# Patient Record
Sex: Male | Born: 1965 | Race: Black or African American | Hispanic: No | Marital: Single | Smoking: Never smoker
Health system: Southern US, Community
[De-identification: ages and names within clinical notes are randomized; demographics above are authoritative.]

## PROBLEM LIST (undated history)

## (undated) DIAGNOSIS — E119 Type 2 diabetes mellitus without complications: Secondary | ICD-10-CM

## (undated) DIAGNOSIS — D649 Anemia, unspecified: Secondary | ICD-10-CM

## (undated) DIAGNOSIS — N189 Chronic kidney disease, unspecified: Secondary | ICD-10-CM

## (undated) DIAGNOSIS — K219 Gastro-esophageal reflux disease without esophagitis: Secondary | ICD-10-CM

## (undated) DIAGNOSIS — Z9119 Patient's noncompliance with other medical treatment and regimen: Secondary | ICD-10-CM

## (undated) DIAGNOSIS — Z91199 Patient's noncompliance with other medical treatment and regimen due to unspecified reason: Secondary | ICD-10-CM

## (undated) DIAGNOSIS — I10 Essential (primary) hypertension: Secondary | ICD-10-CM

## (undated) HISTORY — PX: BACK SURGERY: SHX140

---

## 1997-12-21 ENCOUNTER — Encounter: Admission: RE | Admit: 1997-12-21 | Discharge: 1997-12-21 | Payer: Self-pay | Admitting: Hematology and Oncology

## 2010-03-06 ENCOUNTER — Emergency Department (HOSPITAL_COMMUNITY): Payer: Self-pay

## 2010-03-06 ENCOUNTER — Emergency Department (HOSPITAL_COMMUNITY)
Admission: EM | Admit: 2010-03-06 | Discharge: 2010-03-06 | Disposition: A | Discharge status: Home / Self Care | Payer: Self-pay | Attending: Emergency Medicine | Admitting: Emergency Medicine

## 2010-03-06 DIAGNOSIS — I10 Essential (primary) hypertension: Secondary | ICD-10-CM | POA: Insufficient documentation

## 2010-03-06 DIAGNOSIS — E119 Type 2 diabetes mellitus without complications: Secondary | ICD-10-CM | POA: Insufficient documentation

## 2010-03-06 DIAGNOSIS — R51 Headache: Secondary | ICD-10-CM | POA: Insufficient documentation

## 2010-03-06 LAB — CBC
HCT: 38.9 % — ABNORMAL LOW (ref 39.0–52.0)
Hemoglobin: 13.5 g/dL (ref 13.0–17.0)
MCV: 91.5 fL (ref 78.0–100.0)
RBC: 4.25 MIL/uL (ref 4.22–5.81)
RDW: 11.7 % (ref 11.5–15.5)
WBC: 5.5 10*3/uL (ref 4.0–10.5)

## 2010-03-06 LAB — DIFFERENTIAL
Eosinophils Relative: 4 % (ref 0–5)
Lymphocytes Relative: 38 % (ref 12–46)
Lymphs Abs: 2.1 10*3/uL (ref 0.7–4.0)

## 2010-03-06 LAB — COMPREHENSIVE METABOLIC PANEL
Alkaline Phosphatase: 44 U/L (ref 39–117)
BUN: 12 mg/dL (ref 6–23)
CO2: 27 mEq/L (ref 19–32)
Chloride: 102 mEq/L (ref 96–112)
Glucose, Bld: 212 mg/dL — ABNORMAL HIGH (ref 70–99)
Potassium: 4.2 mEq/L (ref 3.5–5.1)
Total Bilirubin: 0.5 mg/dL (ref 0.3–1.2)

## 2017-04-17 ENCOUNTER — Encounter (HOSPITAL_COMMUNITY): Payer: Self-pay | Admitting: Emergency Medicine

## 2017-04-17 ENCOUNTER — Emergency Department (HOSPITAL_COMMUNITY)
Admission: EM | Admit: 2017-04-17 | Discharge: 2017-04-17 | Disposition: A | Discharge status: Home / Self Care | Payer: Self-pay | Attending: Emergency Medicine | Admitting: Emergency Medicine

## 2017-04-17 ENCOUNTER — Emergency Department (HOSPITAL_COMMUNITY): Payer: Self-pay

## 2017-04-17 ENCOUNTER — Other Ambulatory Visit: Payer: Self-pay

## 2017-04-17 DIAGNOSIS — E1165 Type 2 diabetes mellitus with hyperglycemia: Secondary | ICD-10-CM | POA: Insufficient documentation

## 2017-04-17 DIAGNOSIS — R739 Hyperglycemia, unspecified: Secondary | ICD-10-CM

## 2017-04-17 DIAGNOSIS — Z79899 Other long term (current) drug therapy: Secondary | ICD-10-CM | POA: Insufficient documentation

## 2017-04-17 DIAGNOSIS — R1031 Right lower quadrant pain: Secondary | ICD-10-CM | POA: Insufficient documentation

## 2017-04-17 DIAGNOSIS — Z7984 Long term (current) use of oral hypoglycemic drugs: Secondary | ICD-10-CM | POA: Insufficient documentation

## 2017-04-17 DIAGNOSIS — R109 Unspecified abdominal pain: Secondary | ICD-10-CM

## 2017-04-17 HISTORY — DX: Type 2 diabetes mellitus without complications: E11.9

## 2017-04-17 LAB — CBC WITH DIFFERENTIAL/PLATELET
BASOS ABS: 0 10*3/uL (ref 0.0–0.1)
Basophils Relative: 0 %
Eosinophils Absolute: 0.1 10*3/uL (ref 0.0–0.7)
Eosinophils Relative: 1 %
HCT: 36 % — ABNORMAL LOW (ref 39.0–52.0)
Hemoglobin: 12.3 g/dL — ABNORMAL LOW (ref 13.0–17.0)
LYMPHS PCT: 35 %
Lymphs Abs: 2.2 10*3/uL (ref 0.7–4.0)
MCH: 31.5 pg (ref 26.0–34.0)
MCHC: 34.2 g/dL (ref 30.0–36.0)
MCV: 92.3 fL (ref 78.0–100.0)
Monocytes Absolute: 0.7 10*3/uL (ref 0.1–1.0)
Monocytes Relative: 11 %
Neutro Abs: 3.3 10*3/uL (ref 1.7–7.7)
Neutrophils Relative %: 53 %
PLATELETS: 303 10*3/uL (ref 150–400)
RBC: 3.9 MIL/uL — ABNORMAL LOW (ref 4.22–5.81)
RDW: 12.5 % (ref 11.5–15.5)
WBC: 6.4 10*3/uL (ref 4.0–10.5)

## 2017-04-17 LAB — COMPREHENSIVE METABOLIC PANEL
ALBUMIN: 2.7 g/dL — AB (ref 3.5–5.0)
ALT: 17 U/L (ref 17–63)
ANION GAP: 9 (ref 5–15)
AST: 19 U/L (ref 15–41)
Alkaline Phosphatase: 76 U/L (ref 38–126)
BUN: 12 mg/dL (ref 6–20)
CHLORIDE: 95 mmol/L — AB (ref 101–111)
CO2: 27 mmol/L (ref 22–32)
Calcium: 8.8 mg/dL — ABNORMAL LOW (ref 8.9–10.3)
Creatinine, Ser: 1.27 mg/dL — ABNORMAL HIGH (ref 0.61–1.24)
GFR calc non Af Amer: >60 mL/min (ref >60)
GLUCOSE: 505 mg/dL — AB (ref 65–99)
POTASSIUM: 4.2 mmol/L (ref 3.5–5.1)
SODIUM: 131 mmol/L — AB (ref 135–145)
Total Bilirubin: 0.5 mg/dL (ref 0.3–1.2)
Total Protein: 6.5 g/dL (ref 6.5–8.1)

## 2017-04-17 LAB — URINALYSIS, ROUTINE W REFLEX MICROSCOPIC
BACTERIA UA: NONE SEEN
BILIRUBIN URINE: NEGATIVE
Glucose, UA: >=500 mg/dL — AB
KETONES UR: NEGATIVE mg/dL
LEUKOCYTES UA: NEGATIVE
NITRITE: NEGATIVE
PH: 7 (ref 5.0–8.0)
SQUAMOUS EPITHELIAL / LPF: NONE SEEN
Specific Gravity, Urine: 1.021 (ref 1.005–1.030)

## 2017-04-17 MED ORDER — IBUPROFEN 400 MG PO TABS
400.0000 mg | ORAL_TABLET | Freq: Once | ORAL | Status: AC
  Administered 2017-04-17: 400 mg via ORAL
  Filled 2017-04-17: qty 1

## 2017-04-17 MED ORDER — METFORMIN HCL 500 MG PO TABS
500.0000 mg | ORAL_TABLET | Freq: Once | ORAL | Status: AC
  Administered 2017-04-17: 500 mg via ORAL
  Filled 2017-04-17: qty 1

## 2017-04-17 MED ORDER — METFORMIN HCL 500 MG PO TABS: 500.0000 mg | ORAL_TABLET | Freq: Two times a day (BID) | ORAL | 0 refills | Status: DC

## 2017-04-17 MED ORDER — LISINOPRIL 10 MG PO TABS: 10.0000 mg | ORAL_TABLET | Freq: Every day | ORAL | 0 refills | Status: DC

## 2017-04-17 MED ORDER — SODIUM CHLORIDE 0.9 % IV BOLUS
1000.0000 mL | Freq: Once | INTRAVENOUS | Status: AC
  Administered 2017-04-17: 1000 mL via INTRAVENOUS

## 2017-04-17 MED ORDER — IBUPROFEN 400 MG PO TABS: 400.0000 mg | ORAL_TABLET | Freq: Three times a day (TID) | ORAL | 0 refills | Status: AC

## 2017-04-17 MED ORDER — LISINOPRIL 10 MG PO TABS
10.0000 mg | ORAL_TABLET | Freq: Once | ORAL | Status: AC
  Administered 2017-04-17: 10 mg via ORAL
  Filled 2017-04-17: qty 1

## 2017-04-17 NOTE — ED Triage Notes (Signed)
PT c/o right sided lower back pain that radiates to the right lower abdomen with no n/v/d x1 week. PT also denies urinary  Symptoms.

## 2017-04-17 NOTE — ED Provider Notes (Signed)
[facility] EMERGENCY DEPARTMENT Provider Note   CSN: [id number] Arrival date & time: 04/17/17  1554     History   Chief Complaint Chief Complaint  Patient presents with  . Flank Pain    HPI Paul Merritt is a 52 y.o. male.  HPI Concern flank pain. Pain is been present for about 1 week, beginning without clear precipitant. Since onset the pain is been persistent, though with waxing/waning severity. Pain is from the right flank radiating towards the suprapubic region and right inguinal crease. No dysuria, no hematuria, no scrotal pain or swelling. No fever, no chills. There is some ongoing generalized weakness, but no other focal pain or focal weakness. No medication taken for relief. After speaking with a colleague who suggested the patient may have kidney stones he presents for evaluation.  Past Medical History:  Diagnosis Date  . Diabetes mellitus without complication ([place])     There are no active problems to display for this patient.   Past Surgical History:  Procedure Laterality Date  . BACK SURGERY          Home Medications    Prior to Admission medications   Medication Sig Start Date End Date Taking? Authorizing Provider  ibuprofen (ADVIL,MOTRIN) 400 MG tablet Take 1 tablet (400 mg total) by mouth 3 (three) times daily for 3 days. Take one tablet three times daily for three days 04/17/17 04/20/17  Carmin Muskrat, MD  lisinopril (PRINIVIL,ZESTRIL) 10 MG tablet Take 1 tablet (10 mg total) by mouth daily. 04/17/17   Carmin Muskrat, MD  metFORMIN (GLUCOPHAGE) 500 MG tablet Take 1 tablet (500 mg total) by mouth 2 (two) times daily. 04/17/17   Carmin Muskrat, MD    Family History History reviewed. No pertinent family history.  Social History Social History   Tobacco Use  . Smoking status: Never Smoker  . Smokeless tobacco: Never Used  Substance Use Topics  . Alcohol use: Yes    Comment: occ  . Drug use: Never     Allergies   Patient has no known  allergies.   Review of Systems Review of Systems  Constitutional:       Per HPI, otherwise negative  HENT:       Per HPI, otherwise negative  Respiratory:       Per HPI, otherwise negative  Cardiovascular:       Per HPI, otherwise negative  Gastrointestinal: Negative for vomiting.  Endocrine:       Negative aside from HPI  Genitourinary:       Neg aside from HPI   Musculoskeletal:       Per HPI, otherwise negative  Skin: Negative.   Neurological: Negative for syncope.     Physical Exam Updated Vital Signs BP (!) 207/119   Pulse 89   Temp 98.7 F (37.1 C) (Oral)   Resp 14   Ht 5\' 9"  (1.753 m)   Wt 82 kg (180 lb 12.8 oz)   SpO2 98%   BMI 26.70 kg/m   Physical Exam  Constitutional: He is oriented to person, place, and time. He appears well-developed. No distress.  HENT:  Head: Normocephalic and atraumatic.  Eyes: Conjunctivae and EOM are normal.  Cardiovascular: Normal rate and regular rhythm.  Pulmonary/Chest: Effort normal. No stridor. No respiratory distress.  Abdominal: He exhibits no distension.  No appreciable tenderness to palpation  Musculoskeletal: He exhibits no edema.  Neurological: He is alert and oriented to person, place, and time.  Skin: Skin is warm and dry.  Psychiatric: He has a normal mood and affect.  Nursing note and vitals reviewed.    ED Treatments / Results  Labs (all labs ordered are listed, but only abnormal results are displayed) Labs Reviewed  CBC WITH DIFFERENTIAL/PLATELET - Abnormal; Notable for the following components:      Result Value   RBC 3.90 (*)    Hemoglobin 12.3 (*)    HCT 36.0 (*)    All other components within normal limits  COMPREHENSIVE METABOLIC PANEL - Abnormal; Notable for the following components:   Sodium 131 (*)    Chloride 95 (*)    Glucose, Bld 505 (*)    Creatinine, Ser 1.27 (*)    Calcium 8.8 (*)    Albumin 2.7 (*)    All other components within normal limits  URINALYSIS, ROUTINE W REFLEX  MICROSCOPIC - Abnormal; Notable for the following components:   Color, Urine STRAW (*)    Glucose, UA >=500 (*)    Hgb urine dipstick MODERATE (*)    Protein, ur >=300 (*)    All other components within normal limits    EKG None  Radiology Ct Renal Stone Study  Result Date: 04/17/2017 CLINICAL DATA:  Right-sided lower back pain radiating to the right lower abdomen. EXAM: CT ABDOMEN AND PELVIS WITHOUT CONTRAST TECHNIQUE: Multidetector CT imaging of the abdomen and pelvis was performed following the standard protocol without IV contrast. COMPARISON:  None. FINDINGS: Lower chest: The included heart is normal in size without pericardial effusion. Bibasilar atelectasis is identified, slightly more confluent in the left lower lobe and may reflect an area of pneumonia. Subtle nodularity likely reflects adjacent area of pleuroparenchymal scarring based on the coronal and sagittal reformats. Hepatobiliary: Unremarkable noncontrast appearance of the liver. Physiologic distention of the gallbladder with subtle layering biliary sludge. The appendix is seen adjacent to the gallbladder in the gallbladder fossa. Pancreas: Mild fatty infiltration of the pancreas. No ductal dilatation, inflammation or mass. Spleen: Normal Adrenals/Urinary Tract: Normal bilateral adrenal glands. No hydroureteronephrosis or nephrolithiasis. The unenhanced kidneys are unremarkable without apparent mass. Unremarkable appearance of the bladder for degree of distention. Stomach/Bowel: Physiologic distention of the stomach with normal small bowel rotation. No bowel obstruction or inflammation. Moderate stool retention within the colon without obstruction or inflammation. Normal appendix as described, adjacent to the gallbladder without inflammation. Vascular/Lymphatic: No significant vascular findings are present. No enlarged abdominal or pelvic lymph nodes. Reproductive: Peripheral zone calcifications are seen of the normal size prostate.  Other: No free air nor free fluid. Tiny umbilical fat containing hernia. Musculoskeletal: Dextroconvex curvature of the thoracic spine traversed by single Harrington rod that extends to the L4 level. Associated lower lumbar degenerative disc disease with vacuum disc phenomena at L4-5 and L5-S1. Facet ankylosis is seen along the included thoracolumbar spine at L4. Facet arthropathy seen at L4-5. IMPRESSION: 1. Bibasilar atelectasis left greater than right. Slightly more confluent airspace opacities in the left lower lobe may reflect subtle pneumonia. Subtle nodularity within the left lower lobe is believed to represent atelectatic change and/or scarring when cross referenced with reformatted images. 2. No nephrolithiasis nor hydroureteronephrosis. 3. Dextroconvex curvature of the thoracolumbar spine with ankylosed facets to the level of the L4 vertebral body. Single Harrington rod is seen traversing the scoliotic spine, the distal most aspect extending to L4. No hardware failure. 4. Degenerative disc disease L4-5 and L5-S1 with facet arthropathy at L4-5. Electronically Signed   By: Ashley Royalty M.D.   On: 04/17/2017 18:05    Procedures  Procedures (including critical care time)  Medications Ordered in ED Medications  metFORMIN (GLUCOPHAGE) tablet 500 mg (has no administration in time range)  ibuprofen (ADVIL,MOTRIN) tablet 400 mg (has no administration in time range)  lisinopril (PRINIVIL,ZESTRIL) tablet 10 mg (has no administration in time range)  sodium chloride 0.9 % bolus 1,000 mL (1,000 mLs Intravenous New Bag/Given 04/17/17 1740)     Initial Impression / Assessment and Plan / ED Course  I have reviewed the triage vital signs and the nursing notes.  Pertinent labs & imaging results that were available during my care of the patient were reviewed by me and considered in my medical decision making (see chart for details).     6:46 PM On repeat exam patient is awake and alert, in no distress. We  discussed all findings, he has no new complaints per He now states that he was previously on medication for diabetes, but has not taken any metformin in months. She does have substantial hyperglycemia, but no anion gap, no distress, no nausea, no vomiting, low suspicion for DKA. Patient amenable to restarting metformin, lisinopril, and short course of ibuprofen for most likely recent passed kidney stone given his hematuria, flank pain. No evidence for obstruction, infection, and CT abdomen pelvis was otherwise generally reassuring. Some nausea possible atelectasis in the lungs, but no respiratory complaints, the patient is not hypoxic, patient will follow up with our outpatient colleagues in 1 week.  Final Clinical Impressions(s) / ED Diagnoses   Final diagnoses:  Right flank pain  Hyperglycemia    ED Discharge Orders        Ordered    ibuprofen (ADVIL,MOTRIN) 400 MG tablet  3 times daily     04/17/17 1837    metFORMIN (GLUCOPHAGE) 500 MG tablet  2 times daily     04/17/17 1837    lisinopril (PRINIVIL,ZESTRIL) 10 MG tablet  Daily     04/17/17 1846       Carmin Muskrat, MD 04/17/17 [phone]

## 2017-04-17 NOTE — ED Notes (Signed)
Lower back pain mainly on R lower back and sometimes in rt lower quadrant of abdomen. Experiencing pain for approx. 1 week. No change in level pain  Rated at 5/6. Pt has rod in his back but this is different pain. Has not used any medication for pain relief.

## 2017-04-17 NOTE — Discharge Instructions (Signed)
As discussed, today's evaluation has been generally reassuring, though there is evidence that you recently passed a kidney stone. Additionally, there is evidence for ongoing high blood glucose, it is important that you resume taking metformin. Please use the prescribed metformin, and prescribed ibuprofen as directed. Please be sure to follow-up with our primary care colleagues in 1 week for repeat evaluation or return here for concerning changes in your condition.

## 2017-04-17 NOTE — ED Notes (Signed)
Notified MD of pt.'s high BP

## 2017-04-17 NOTE — ED Notes (Signed)
CRITICAL VALUE ALERT  Critical Value:  Glucose 505  Date & Time Notied: 04/17/17 1706  Provider Notified: Notified Dr. Vanita Panda  Orders Received/Actio" Notified Dr. Vanita Panda

## 2017-10-02 ENCOUNTER — Emergency Department (HOSPITAL_COMMUNITY): Payer: Self-pay

## 2017-10-02 ENCOUNTER — Encounter (HOSPITAL_COMMUNITY): Payer: Self-pay

## 2017-10-02 ENCOUNTER — Inpatient Hospital Stay (HOSPITAL_COMMUNITY)
Admission: EM | Admit: 2017-10-02 | Discharge: 2017-10-05 | DRG: 377 | Disposition: A | Discharge status: Home / Self Care | Payer: Self-pay | Attending: Family Medicine | Admitting: Family Medicine

## 2017-10-02 ENCOUNTER — Other Ambulatory Visit: Payer: Self-pay

## 2017-10-02 DIAGNOSIS — Z9114 Patient's other noncompliance with medication regimen: Secondary | ICD-10-CM

## 2017-10-02 DIAGNOSIS — I248 Other forms of acute ischemic heart disease: Secondary | ICD-10-CM | POA: Diagnosis present

## 2017-10-02 DIAGNOSIS — Z91199 Patient's noncompliance with other medical treatment and regimen due to unspecified reason: Secondary | ICD-10-CM

## 2017-10-02 DIAGNOSIS — K921 Melena: Secondary | ICD-10-CM

## 2017-10-02 DIAGNOSIS — D62 Acute posthemorrhagic anemia: Secondary | ICD-10-CM | POA: Diagnosis present

## 2017-10-02 DIAGNOSIS — E871 Hypo-osmolality and hyponatremia: Secondary | ICD-10-CM

## 2017-10-02 DIAGNOSIS — K259 Gastric ulcer, unspecified as acute or chronic, without hemorrhage or perforation: Secondary | ICD-10-CM | POA: Diagnosis present

## 2017-10-02 DIAGNOSIS — Z7984 Long term (current) use of oral hypoglycemic drugs: Secondary | ICD-10-CM

## 2017-10-02 DIAGNOSIS — E1165 Type 2 diabetes mellitus with hyperglycemia: Secondary | ICD-10-CM

## 2017-10-02 DIAGNOSIS — E538 Deficiency of other specified B group vitamins: Secondary | ICD-10-CM | POA: Diagnosis present

## 2017-10-02 DIAGNOSIS — K922 Gastrointestinal hemorrhage, unspecified: Secondary | ICD-10-CM | POA: Diagnosis present

## 2017-10-02 DIAGNOSIS — K254 Chronic or unspecified gastric ulcer with hemorrhage: Principal | ICD-10-CM | POA: Diagnosis present

## 2017-10-02 DIAGNOSIS — Z9119 Patient's noncompliance with other medical treatment and regimen: Secondary | ICD-10-CM

## 2017-10-02 DIAGNOSIS — I10 Essential (primary) hypertension: Secondary | ICD-10-CM | POA: Diagnosis present

## 2017-10-02 DIAGNOSIS — E1100 Type 2 diabetes mellitus with hyperosmolarity without nonketotic hyperglycemic-hyperosmolar coma (NKHHC): Secondary | ICD-10-CM | POA: Diagnosis present

## 2017-10-02 DIAGNOSIS — N179 Acute kidney failure, unspecified: Secondary | ICD-10-CM | POA: Diagnosis present

## 2017-10-02 DIAGNOSIS — Z599 Problem related to housing and economic circumstances, unspecified: Secondary | ICD-10-CM

## 2017-10-02 DIAGNOSIS — IMO0002 Reserved for concepts with insufficient information to code with codable children: Secondary | ICD-10-CM | POA: Diagnosis present

## 2017-10-02 DIAGNOSIS — R079 Chest pain, unspecified: Secondary | ICD-10-CM

## 2017-10-02 DIAGNOSIS — I169 Hypertensive crisis, unspecified: Secondary | ICD-10-CM | POA: Diagnosis present

## 2017-10-02 DIAGNOSIS — B3781 Candidal esophagitis: Secondary | ICD-10-CM | POA: Diagnosis present

## 2017-10-02 DIAGNOSIS — I160 Hypertensive urgency: Secondary | ICD-10-CM | POA: Diagnosis present

## 2017-10-02 DIAGNOSIS — R809 Proteinuria, unspecified: Secondary | ICD-10-CM

## 2017-10-02 DIAGNOSIS — D649 Anemia, unspecified: Secondary | ICD-10-CM

## 2017-10-02 DIAGNOSIS — Z79899 Other long term (current) drug therapy: Secondary | ICD-10-CM

## 2017-10-02 HISTORY — DX: Patient's noncompliance with other medical treatment and regimen: Z91.19

## 2017-10-02 HISTORY — DX: Patient's noncompliance with other medical treatment and regimen due to unspecified reason: Z91.199

## 2017-10-02 HISTORY — DX: Essential (primary) hypertension: I10

## 2017-10-02 LAB — CBC WITH DIFFERENTIAL/PLATELET
Basophils Absolute: 0.1 10*3/uL (ref 0.0–0.1)
Basophils Relative: 1 %
EOS PCT: 3 %
Eosinophils Absolute: 0.2 10*3/uL (ref 0.0–0.7)
HCT: 23 % — ABNORMAL LOW (ref 39.0–52.0)
Hemoglobin: 7.8 g/dL — ABNORMAL LOW (ref 13.0–17.0)
Lymphocytes Relative: 26 %
Lymphs Abs: 1.9 10*3/uL (ref 0.7–4.0)
MCH: 32 pg (ref 26.0–34.0)
MCHC: 33.9 g/dL (ref 30.0–36.0)
MCV: 94.3 fL (ref 78.0–100.0)
MONO ABS: 0.7 10*3/uL (ref 0.1–1.0)
MONOS PCT: 10 %
Neutro Abs: 4.4 10*3/uL (ref 1.7–7.7)
Neutrophils Relative %: 60 %
PLATELETS: 347 10*3/uL (ref 150–400)
RBC: 2.44 MIL/uL — ABNORMAL LOW (ref 4.22–5.81)
RDW: 12.4 % (ref 11.5–15.5)
WBC: 7.3 10*3/uL (ref 4.0–10.5)

## 2017-10-02 LAB — RAPID URINE DRUG SCREEN, HOSP PERFORMED
AMPHETAMINES: NOT DETECTED
BARBITURATES: NOT DETECTED
Benzodiazepines: NOT DETECTED
COCAINE: NOT DETECTED
OPIATES: NOT DETECTED
TETRAHYDROCANNABINOL: NOT DETECTED

## 2017-10-02 LAB — URINALYSIS, ROUTINE W REFLEX MICROSCOPIC
BACTERIA UA: NONE SEEN
Bilirubin Urine: NEGATIVE
Glucose, UA: >=500 mg/dL — AB
KETONES UR: NEGATIVE mg/dL
Leukocytes, UA: NEGATIVE
Nitrite: NEGATIVE
PH: 7 (ref 5.0–8.0)
Protein, ur: >=300 mg/dL — AB
SPECIFIC GRAVITY, URINE: 1.017 (ref 1.005–1.030)

## 2017-10-02 LAB — FOLATE: FOLATE: 5.1 ng/mL — AB (ref >5.9)

## 2017-10-02 LAB — BASIC METABOLIC PANEL
Anion gap: 4 — ABNORMAL LOW (ref 5–15)
BUN: 27 mg/dL — AB (ref 6–20)
CALCIUM: 8.2 mg/dL — AB (ref 8.9–10.3)
CO2: 24 mmol/L (ref 22–32)
CREATININE: 1.99 mg/dL — AB (ref 0.61–1.24)
Chloride: 101 mmol/L (ref 98–111)
GFR calc Af Amer: 43 mL/min — ABNORMAL LOW (ref >60)
GFR calc non Af Amer: 37 mL/min — ABNORMAL LOW (ref >60)
GLUCOSE: 431 mg/dL — AB (ref 70–99)
Potassium: 5 mmol/L (ref 3.5–5.1)
Sodium: 129 mmol/L — ABNORMAL LOW (ref 135–145)

## 2017-10-02 LAB — CBG MONITORING, ED: GLUCOSE-CAPILLARY: 442 mg/dL — AB (ref 70–99)

## 2017-10-02 LAB — GLUCOSE, CAPILLARY
Glucose-Capillary: 163 mg/dL — ABNORMAL HIGH (ref 70–99)
Glucose-Capillary: 197 mg/dL — ABNORMAL HIGH (ref 70–99)

## 2017-10-02 LAB — IRON AND TIBC
Iron: 30 ug/dL — ABNORMAL LOW (ref 45–182)
Saturation Ratios: 14 % — ABNORMAL LOW (ref 17.9–39.5)
TIBC: 207 ug/dL — ABNORMAL LOW (ref 250–450)
UIBC: 177 ug/dL

## 2017-10-02 LAB — FERRITIN: Ferritin: 27 ng/mL (ref 24–336)

## 2017-10-02 LAB — PREPARE RBC (CROSSMATCH)

## 2017-10-02 LAB — VITAMIN B12: VITAMIN B 12: 208 pg/mL (ref 180–914)

## 2017-10-02 LAB — TROPONIN I
Troponin I: <0.03 ng/mL (ref <0.03)
Troponin I: <0.03 ng/mL (ref <0.03)

## 2017-10-02 LAB — ABO/RH: ABO/RH(D): A POS

## 2017-10-02 MED ORDER — SODIUM CHLORIDE 0.9 % IV BOLUS
1000.0000 mL | Freq: Once | INTRAVENOUS | Status: AC
  Administered 2017-10-02: 1000 mL via INTRAVENOUS

## 2017-10-02 MED ORDER — HYDRALAZINE HCL 20 MG/ML IJ SOLN
20.0000 mg | Freq: Four times a day (QID) | INTRAMUSCULAR | Status: DC | PRN
  Administered 2017-10-03: 20 mg via INTRAVENOUS
  Filled 2017-10-02: qty 1

## 2017-10-02 MED ORDER — ALBUTEROL SULFATE (2.5 MG/3ML) 0.083% IN NEBU: 2.5000 mg | INHALATION_SOLUTION | RESPIRATORY_TRACT | Status: DC | PRN

## 2017-10-02 MED ORDER — LABETALOL HCL 5 MG/ML IV SOLN
5.0000 mg | Freq: Once | INTRAVENOUS | Status: AC
  Administered 2017-10-02: 5 mg via INTRAVENOUS
  Filled 2017-10-02: qty 4

## 2017-10-02 MED ORDER — SODIUM CHLORIDE 0.9% IV SOLUTION: Freq: Once | INTRAVENOUS | Status: DC

## 2017-10-02 MED ORDER — SODIUM CHLORIDE 0.9 % IV SOLN
8.0000 mg/h | INTRAVENOUS | Status: DC
  Administered 2017-10-03: 8 mg/h via INTRAVENOUS
  Filled 2017-10-02 (×6): qty 80

## 2017-10-02 MED ORDER — SODIUM CHLORIDE 0.9 % IV SOLN
INTRAVENOUS | Status: AC
  Administered 2017-10-02: 19:00:00 via INTRAVENOUS

## 2017-10-02 MED ORDER — SENNOSIDES-DOCUSATE SODIUM 8.6-50 MG PO TABS: 1.0000 | ORAL_TABLET | Freq: Every evening | ORAL | Status: DC | PRN

## 2017-10-02 MED ORDER — INSULIN GLARGINE 100 UNIT/ML ~~LOC~~ SOLN
10.0000 [IU] | Freq: Two times a day (BID) | SUBCUTANEOUS | Status: DC
  Administered 2017-10-02 – 2017-10-04 (×3): 10 [IU] via SUBCUTANEOUS
  Filled 2017-10-02 (×10): qty 0.1

## 2017-10-02 MED ORDER — INSULIN ASPART 100 UNIT/ML ~~LOC~~ SOLN
10.0000 [IU] | Freq: Once | SUBCUTANEOUS | Status: AC
  Administered 2017-10-02: 10 [IU] via SUBCUTANEOUS

## 2017-10-02 MED ORDER — ONDANSETRON HCL 4 MG/2ML IJ SOLN: 4.0000 mg | Freq: Four times a day (QID) | INTRAMUSCULAR | Status: DC | PRN

## 2017-10-02 MED ORDER — ACETAMINOPHEN 650 MG RE SUPP: 650.0000 mg | Freq: Four times a day (QID) | RECTAL | Status: DC | PRN

## 2017-10-02 MED ORDER — SODIUM CHLORIDE 0.9 % IV SOLN
80.0000 mg | Freq: Once | INTRAVENOUS | Status: AC
  Administered 2017-10-03: 80 mg via INTRAVENOUS
  Filled 2017-10-02: qty 40

## 2017-10-02 MED ORDER — INSULIN ASPART 100 UNIT/ML ~~LOC~~ SOLN
0.0000 [IU] | Freq: Every day | SUBCUTANEOUS | Status: DC
  Administered 2017-10-03: 2 [IU] via SUBCUTANEOUS

## 2017-10-02 MED ORDER — PANTOPRAZOLE SODIUM 40 MG IV SOLR: 40.0000 mg | Freq: Two times a day (BID) | INTRAVENOUS | Status: DC

## 2017-10-02 MED ORDER — LISINOPRIL 10 MG PO TABS
10.0000 mg | ORAL_TABLET | Freq: Every day | ORAL | Status: DC
  Administered 2017-10-02: 10 mg via ORAL
  Filled 2017-10-02: qty 1

## 2017-10-02 MED ORDER — HYDRALAZINE HCL 20 MG/ML IJ SOLN: 20.0000 mg | Freq: Three times a day (TID) | INTRAMUSCULAR | Status: DC | PRN

## 2017-10-02 MED ORDER — PANTOPRAZOLE SODIUM 40 MG IV SOLR
INTRAVENOUS | Status: AC
  Filled 2017-10-02: qty 160

## 2017-10-02 MED ORDER — ACETAMINOPHEN 325 MG PO TABS: 650.0000 mg | ORAL_TABLET | Freq: Four times a day (QID) | ORAL | Status: DC | PRN

## 2017-10-02 MED ORDER — ONDANSETRON HCL 4 MG PO TABS: 4.0000 mg | ORAL_TABLET | Freq: Four times a day (QID) | ORAL | Status: DC | PRN

## 2017-10-02 MED ORDER — SODIUM CHLORIDE 0.9% FLUSH
3.0000 mL | Freq: Two times a day (BID) | INTRAVENOUS | Status: DC
  Administered 2017-10-03 (×2): 3 mL via INTRAVENOUS

## 2017-10-02 MED ORDER — INSULIN ASPART 100 UNIT/ML ~~LOC~~ SOLN
0.0000 [IU] | Freq: Three times a day (TID) | SUBCUTANEOUS | Status: DC
  Administered 2017-10-03: 2 [IU] via SUBCUTANEOUS
  Administered 2017-10-04: 3 [IU] via SUBCUTANEOUS
  Administered 2017-10-04: 2 [IU] via SUBCUTANEOUS
  Administered 2017-10-04: 5 [IU] via SUBCUTANEOUS

## 2017-10-02 MED ORDER — INSULIN ASPART 100 UNIT/ML ~~LOC~~ SOLN
5.0000 [IU] | Freq: Once | SUBCUTANEOUS | Status: AC
  Administered 2017-10-02: 5 [IU] via SUBCUTANEOUS
  Filled 2017-10-02: qty 1

## 2017-10-02 NOTE — ED Notes (Signed)
EDP notified of cbg 442 and bp 187/104.  Labs ordered and ekg performed.

## 2017-10-02 NOTE — ED Provider Notes (Signed)
[facility] EMERGENCY DEPARTMENT Provider Note   CSN: [id number] Arrival date & time: 10/02/17  1119     History   Chief Complaint Chief Complaint  Patient presents with  . Headache  . Blurred Vision    HPI Paul Merritt is a 52 y.o. male.  HPI  Pt was seen at 1510. Per pt, c/o unknown onset and persistence of constant "blurry vision" that was noticed when he woke up yesterday morning. Pt states he also had a 30 minute episode of left sided chest "pain" yesterday. This CP occurred on exertion, improved with resting. Pt states he has not taken his BP or DM meds for over 1 month because he does not have a PCP. Pt does not check his CBG or BP at home. Denies CP currently. Denies eye pain, no injury, no discharge, no double vision. Denies palpitations, no SOB/cough, no abd pain, no N/V/D, no back pain, no focal motor weakness, no tingling/numbness in extremities, no fevers, no rash.    Past Medical History:  Diagnosis Date  . Diabetes mellitus without complication ([place])   . Hypertension   . Noncompliance     There are no active problems to display for this patient.   Past Surgical History:  Procedure Laterality Date  . BACK SURGERY          Home Medications    Prior to Admission medications   Medication Sig Start Date End Date Taking? Authorizing Provider  naproxen sodium (ALEVE) 220 MG tablet Take 220-440 mg by mouth daily as needed.   Yes [provider]  lisinopril (PRINIVIL,ZESTRIL) 10 MG tablet Take 1 tablet (10 mg total) by mouth daily. Patient not taking: Reported on 10/02/2017 04/17/17   Carmin Muskrat, MD  metFORMIN (GLUCOPHAGE) 500 MG tablet Take 1 tablet (500 mg total) by mouth 2 (two) times daily. Patient not taking: Reported on 10/02/2017 04/17/17   Carmin Muskrat, MD    Family History No family history on file.  Social History Social History   Tobacco Use  . Smoking status: Never Smoker  . Smokeless tobacco: Never Used  Substance Use  Topics  . Alcohol use: Yes    Comment: occ  . Drug use: Never     Allergies   Patient has no known allergies.   Review of Systems Review of Systems ROS: Statement: All systems negative except as marked or noted in the HPI; Constitutional: Negative for fever and chills. ; ; Eyes: Negative for eye pain, redness and discharge. ; ; ENMT: Negative for ear pain, hoarseness, nasal congestion, sinus pressure and sore throat. ; ; Cardiovascular: +CP. Negative for palpitations, diaphoresis, dyspnea and peripheral edema. ; ; Respiratory: Negative for cough, wheezing and stridor. ; ; Gastrointestinal: Negative for nausea, vomiting, diarrhea, abdominal pain, blood in stool, hematemesis, jaundice and rectal bleeding. . ; ; Genitourinary: Negative for dysuria, flank pain and hematuria. ; ; Musculoskeletal: Negative for back pain and neck pain. Negative for swelling and trauma.; ; Skin: Negative for pruritus, rash, abrasions, blisters, bruising and skin lesion.; ; Neuro: +blurry vision. Negative for headache, lightheadedness and neck stiffness. Negative for weakness, altered level of consciousness, altered mental status, extremity weakness, paresthesias, involuntary movement, seizure and syncope.       Physical Exam Updated Vital Signs BP (!) 195/107 (BP Location: Left Arm)   Pulse 93   Temp 98.1 F (36.7 C) (Oral)   Resp 16   Ht 5\' 9"  (1.753 m)   Wt 90.7 kg   SpO2 100%  BMI 29.53 kg/m    Patient Vitals for the past 24 hrs:  BP Temp Temp src Pulse Resp SpO2 Height Weight  10/02/17 1522 (!) 195/107 - - 93 16 100 % - -  10/02/17 1244 (!) 182/98 98.1 F (36.7 C) Oral 95 18 99 % - -  10/02/17 1139 - - - - - - 5\' 9"  (1.753 m) 90.7 kg  10/02/17 1138 (!) 187/104 97.9 F (36.6 C) Oral 95 20 100 % - -   16:42 Visual Acuity FS  Visual Acuity  Bilateral Distance: 20/70   R Distance: 20/70   L Distance: 20/50      Physical Exam 1515: Physical examination:  Nursing notes reviewed; Vital signs  and O2 SAT reviewed;  Constitutional: Well developed, Well nourished, Well hydrated, In no acute distress; Head:  Normocephalic, atraumatic; Eyes: EOMI, PERRL, No scleral icterus; ENMT: Mouth and pharynx normal, Mucous membranes moist; Neck: Supple, Full range of motion, No lymphadenopathy; Cardiovascular: Regular rate and rhythm, No gallop; Respiratory: Breath sounds clear & equal bilaterally, No wheezes.  Speaking full sentences with ease, Normal respiratory effort/excursion; Chest: Nontender, Movement normal; Abdomen: Soft, Nontender, Nondistended, Normal bowel sounds. Rectal exam performed w/permission of pt and ED RN chaperone present.  Anal tone normal.  Non-tender, black stool in rectal vault, heme positive.  No fissures, no external hemorrhoids, no palp masses.;;; Genitourinary: No CVA tenderness; Extremities: Peripheral pulses normal, No tenderness, No edema, No calf edema or asymmetry.; Neuro: AA&Ox3, Major CN grossly intact. No facial droop. Speech clear. No gross focal motor or sensory deficits in extremities. Climbs on and off stretcher easily by himself. Gait steady..; Skin: Color normal, Warm, Dry.   ED Treatments / Results  Labs (all labs ordered are listed, but only abnormal results are displayed)   EKG EKG Interpretation  Date/Time:  Wednesday October 02 2017 11:46:29 EDT Ventricular Rate:  99 PR Interval:  140 QRS Duration: 82 QT Interval:  324 QTC Calculation: 415 R Axis:   66 Text Interpretation:  Normal sinus rhythm Normal ECG No old tracing to compare Confirmed by Francine Graven [phone]) on 10/02/2017 4:00:39 PM   Radiology   Procedures Procedures (including critical care time)  Medications Ordered in ED Medications - No data to display   Initial Impression / Assessment and Plan / ED Course  I have reviewed the triage vital signs and the nursing notes.  Pertinent labs & imaging results that were available during my care of the patient were reviewed by me  and considered in my medical decision making (see chart for details).  MDM Reviewed: previous chart, nursing note and vitals Reviewed previous: labs Interpretation: labs, ECG, x-ray and CT scan Total time providing critical care: 30-74 minutes. This excludes time spent performing separately reportable procedures and services. Consults: admitting MD   CRITICAL CARE Performed by: Francine Graven Total critical care time: 35 minutes Critical care time was exclusive of separately billable procedures and treating other patients. Critical care was necessary to treat or prevent imminent or life-threatening deterioration. Critical care was time spent personally by me on the following activities: development of treatment plan with patient and/or surrogate as well as nursing, discussions with consultants, evaluation of patient's response to treatment, examination of patient, obtaining history from patient or surrogate, ordering and performing treatments and interventions, ordering and review of laboratory studies, ordering and review of radiographic studies, pulse oximetry and re-evaluation of patient's condition.   Results for orders placed or performed during the hospital encounter of 10/02/17  CBC with Differential  Result Value Ref Range   WBC 7.3 4.0 - 10.5 K/uL   RBC 2.44 (L) 4.22 - 5.81 MIL/uL   Hemoglobin 7.8 (L) 13.0 - 17.0 g/dL   HCT 23.0 (L) 39.0 - 52.0 %   MCV 94.3 78.0 - 100.0 fL   MCH 32.0 26.0 - 34.0 pg   MCHC 33.9 30.0 - 36.0 g/dL   RDW 12.4 11.5 - 15.5 %   Platelets 347 150 - 400 K/uL   Neutrophils Relative % 60 %   Neutro Abs 4.4 1.7 - 7.7 K/uL   Lymphocytes Relative 26 %   Lymphs Abs 1.9 0.7 - 4.0 K/uL   Monocytes Relative 10 %   Monocytes Absolute 0.7 0.1 - 1.0 K/uL   Eosinophils Relative 3 %   Eosinophils Absolute 0.2 0.0 - 0.7 K/uL   Basophils Relative 1 %   Basophils Absolute 0.1 0.0 - 0.1 K/uL  Basic metabolic panel  Result Value Ref Range   Sodium 129 (L) 135  - 145 mmol/L   Potassium 5.0 3.5 - 5.1 mmol/L   Chloride 101 98 - 111 mmol/L   CO2 24 22 - 32 mmol/L   Glucose, Bld 431 (H) 70 - 99 mg/dL   BUN 27 (H) 6 - 20 mg/dL   Creatinine, Ser 1.99 (H) 0.61 - 1.24 mg/dL   Calcium 8.2 (L) 8.9 - 10.3 mg/dL   GFR calc non Af Amer 37 (L) >60 mL/min   GFR calc Af Amer 43 (L) >60 mL/min   Anion gap 4 (L) 5 - 15  Troponin I  Result Value Ref Range   Troponin I <0.03 <0.03 ng/mL  Urinalysis, Routine w reflex microscopic  Result Value Ref Range   Color, Urine STRAW (A) YELLOW   APPearance CLEAR CLEAR   Specific Gravity, Urine 1.017 1.005 - 1.030   pH 7.0 5.0 - 8.0   Glucose, UA >=500 (A) NEGATIVE mg/dL   Hgb urine dipstick SMALL (A) NEGATIVE   Bilirubin Urine NEGATIVE NEGATIVE   Ketones, ur NEGATIVE NEGATIVE mg/dL   Protein, ur >=300 (A) NEGATIVE mg/dL   Nitrite NEGATIVE NEGATIVE   Leukocytes, UA NEGATIVE NEGATIVE   RBC / HPF 6-10 0 - 5 RBC/hpf   WBC, UA 0-5 0 - 5 WBC/hpf   Bacteria, UA NONE SEEN NONE SEEN  Urine rapid drug screen (hosp performed)  Result Value Ref Range   Opiates NONE DETECTED NONE DETECTED   Cocaine NONE DETECTED NONE DETECTED   Benzodiazepines NONE DETECTED NONE DETECTED   Amphetamines NONE DETECTED NONE DETECTED   Tetrahydrocannabinol NONE DETECTED NONE DETECTED   Barbiturates NONE DETECTED NONE DETECTED  CBG monitoring, ED  Result Value Ref Range   Glucose-Capillary 442 (H) 70 - 99 mg/dL   Comment 1 Notify RN   Type and screen  Result Value Ref Range   ABO/RH(D) A POS    Antibody Screen PENDING    Sample Expiration      10/05/2017 Performed at [facility], [address], [place], [place] [address]    Dg Chest 2 View Result Date: 10/02/2017 CLINICAL DATA:  Blurry vision and hypertension. EXAM: CHEST - 2 VIEW COMPARISON:  None. FINDINGS: The heart size and mediastinal contours are within normal limits. Both lungs are clear. Incompletely visualized spinal rod with right convex scoliosis. IMPRESSION: No active  cardiopulmonary disease. Electronically Signed   By: Ulyses Jarred M.D.   On: 10/02/2017 16:32   Ct Head Wo Contrast Result Date: 10/02/2017 CLINICAL DATA:  Blurry vision. EXAM: CT HEAD WITHOUT CONTRAST TECHNIQUE: Contiguous axial images were obtained from the base of the skull through the vertex without intravenous contrast. COMPARISON:  CT scan of March 06, 2010. FINDINGS: Brain: No evidence of acute infarction, hemorrhage, hydrocephalus, extra-axial collection or mass lesion/mass effect. Vascular: No hyperdense vessel or unexpected calcification. Skull: Normal. Negative for fracture or focal lesion. Sinuses/Orbits: No acute finding. Other: None. IMPRESSION: Normal head CT. Electronically Signed   By: Marijo Conception, M.D.   On: 10/02/2017 16:37   Results for Paul Merritt, Paul Merritt (MRN [id number]) as of 10/02/2017 17:03  Ref. Range 03/06/2010 13:57 04/17/2017 16:20 10/02/2017 12:32  Hemoglobin Latest Ref Range: 13.0 - 17.0 g/dL 13.5 12.3 (L) 7.8 (L)  HCT Latest Ref Range: 39.0 - 52.0 % 38.9 (L) 36.0 (L) 23.0 (L)   Results for Paul Merritt, Paul Merritt (MRN [id number]) as of 10/02/2017 17:03  Ref. Range 03/06/2010 13:57 04/17/2017 16:20 10/02/2017 12:32  BUN Latest Ref Range: 6 - 20 mg/dL 12 12 27  (H)  Creatinine Latest Ref Range: 0.61 - 1.24 mg/dL 0.99 1.27 (H) 1.99 (H)     1715:  Low dose IV labetolol given for HTN. Denies CP while in the ED. H/H drop over the past 5 months with black heme positive stool on exam. Pt however denies his stools are black or bloody.  BUN/Cr has also elevated over the past 5 months. Na corrects to 134 for elevated glucose. AG normal. IVF NS bolus and SQ insulin ordered.  Dx and testing d/w pt.  Questions answered.  Verb understanding, agreeable to admit T/C returned from [facility] Dr. Manuella Ghazi, case discussed, including:  HPI, pertinent PM/SHx, VS/PE, dx testing, ED course and treatment:  Agreeable to admit.     Final Clinical Impressions(s) / ED Diagnoses   Final diagnoses:  None    ED  Discharge Orders    None       Francine Graven, DO 10/04/17 2057

## 2017-10-02 NOTE — ED Triage Notes (Signed)
Pt reports woke up yesterday and vision was blurry.  Pt denies dizziness.  Reports has a slight headache that started today on the way to the hospital but says it is gone now.    Denies any weakness, numbness, or difficulty speaking or swallowing.  Pt says had a short episode of pain under left breast 2 days ago that went away after taking aleve.  Pt's bp today 187/104.  Pt says his bp medication was prescribed from the ER but he hasn't had it refilled because he doesn't have a pcp.  Pt also takes metformin but is out of it as well.

## 2017-10-02 NOTE — H&P (Signed)
History and Physical  Paul Merritt WHQ:[id number] DOB: 04/16/1965 DOA: 10/02/2017  Referring physician: EDP PCP: Patient, No Pcp Per  Patient coming from: Home  Chief Complaint: Fatigue, blurred vision  HPI: Paul Merritt is a 52 y.o. male with medical history significant for hypertension, diabetes, medication noncompliance, presents to the ED complaining of generalized fatigue, blurred vision, and left-sided chest pain. Left-sided chest pain which he describes as pain on the left breast has been ongoing intermittently for about 1 month, of which he has been taking Aleve twice daily for about a month.  No radiation, no associated shortness of breath.  Currently denies any left-sided chest pain.  For the past few days, patient has been reporting fatigue, with some blurry vision.  Patient denies any abdominal pain, nausea/vomiting, diaphoresis, dizziness, melena, BRBPR, hematemesis.  Of note, patient reports he has not had his blood pressure medication or diabetes medication for about a month as he could not afford to buy them and does not have a PCP.  ED Course: Pt was noted to be in hypertensive crisis, SBP in 190s, CBG also noted to be in the 400s, hyponatremia, AKI, and acute blood loss anemia with hemoglobin of 7.8, 5 months ago it was about 12.3.  Patient admitted for further management  Review of Systems: Review of systems are otherwise negative   Past Medical History:  Diagnosis Date  . Diabetes mellitus without complication ([place])   . Hypertension   . Noncompliance    Past Surgical History:  Procedure Laterality Date  . BACK SURGERY      Social History:  reports that he has never smoked. He has never used smokeless tobacco. He reports that he drinks alcohol. He reports that he does not use drugs.   No Known Allergies  History reviewed. No pertinent family history.    Prior to Admission medications   Medication Sig Start Date End Date Taking? Authorizing Provider  naproxen  sodium (ALEVE) 220 MG tablet Take 220-440 mg by mouth daily as needed.   Yes [provider]  lisinopril (PRINIVIL,ZESTRIL) 10 MG tablet Take 1 tablet (10 mg total) by mouth daily. Patient not taking: Reported on 10/02/2017 04/17/17   Carmin Muskrat, MD  metFORMIN (GLUCOPHAGE) 500 MG tablet Take 1 tablet (500 mg total) by mouth 2 (two) times daily. Patient not taking: Reported on 10/02/2017 04/17/17   Carmin Muskrat, MD    Physical Exam: BP (!) 156/92   Pulse 93   Temp 97.7 F (36.5 C) (Oral)   Resp 16   Ht 5\' 9"  (1.753 m)   Wt 85.8 kg   SpO2 100%   BMI 27.93 kg/m   General: NAD Eyes: Pallor ENT: Normal Neck: Supple Cardiovascular: S1, S2 present Respiratory: CTA B Abdomen: Soft, nontender, nondistended, bowel sounds present Skin: Normal Musculoskeletal: 2+ bilateral pedal edema Psychiatric: Normal mood Neurologic: No focal neurologic deficit noted          Labs on Admission:  Basic Metabolic Panel: Recent Labs  Lab 10/02/17 1232  NA 129*  K 5.0  CL 101  CO2 24  GLUCOSE 431*  BUN 27*  CREATININE 1.99*  CALCIUM 8.2*   Liver Function Tests: No results for input(s): AST, ALT, ALKPHOS, BILITOT, PROT, ALBUMIN in the last 168 hours. No results for input(s): LIPASE, AMYLASE in the last 168 hours. No results for input(s): AMMONIA in the last 168 hours. CBC: Recent Labs  Lab 10/02/17 1232  WBC 7.3  NEUTROABS 4.4  HGB 7.8*  HCT 23.0*  MCV 94.3  PLT 347   Cardiac Enzymes: Recent Labs  Lab 10/02/17 1537  TROPONINI <0.03    BNP (last 3 results) No results for input(s): BNP in the last 8760 hours.  ProBNP (last 3 results) No results for input(s): PROBNP in the last 8760 hours.  CBG: Recent Labs  Lab 10/02/17 1142 10/02/17 1847  GLUCAP 442* 163*    Radiological Exams on Admission: Dg Chest 2 View  Result Date: 10/02/2017 CLINICAL DATA:  Blurry vision and hypertension. EXAM: CHEST - 2 VIEW COMPARISON:  None. FINDINGS: The heart size and  mediastinal contours are within normal limits. Both lungs are clear. Incompletely visualized spinal rod with right convex scoliosis. IMPRESSION: No active cardiopulmonary disease. Electronically Signed   By: Ulyses Jarred M.D.   On: 10/02/2017 16:32   Ct Head Wo Contrast  Result Date: 10/02/2017 CLINICAL DATA:  Blurry vision. EXAM: CT HEAD WITHOUT CONTRAST TECHNIQUE: Contiguous axial images were obtained from the base of the skull through the vertex without intravenous contrast. COMPARISON:  CT scan of March 06, 2010. FINDINGS: Brain: No evidence of acute infarction, hemorrhage, hydrocephalus, extra-axial collection or mass lesion/mass effect. Vascular: No hyperdense vessel or unexpected calcification. Skull: Normal. Negative for fracture or focal lesion. Sinuses/Orbits: No acute finding. Other: None. IMPRESSION: Normal head CT. Electronically Signed   By: Marijo Conception, M.D.   On: 10/02/2017 16:37    EKG: Independently reviewed.  Normal sinus rhythm  Assessment/Plan Present on Admission: . GI bleed . Acute blood loss anemia . Hypertensive crisis . Uncontrolled diabetes mellitus ([place]) . Hyponatremia . AKI (acute kidney injury) ([place])  Principal Problem:   Hypertensive crisis Active Problems:   GI bleed   Acute blood loss anemia   Uncontrolled diabetes mellitus ([facility])   Hyponatremia   Noncompliance   AKI (acute kidney injury) ([place])  Hypertensive crisis Likely secondary to noncompliance CT head negative UDS negative Hydralazine IV as needed, hold home lisinopril for now due to AKI  Acute drop in hemoglobin likely 2/2 upper GI bleed Daily use of Aleve for about 1 month, denies melena, BRBPR Hemoglobin 7.8 on presentation, baseline normal FOBT pending Will transfuse 2U of PRBC Start Protonix drip, gentle hydration Serial CBC GI consulted will see patient in a.m.  Chest pain, ACS rule out Likely atypical, currently chest pain-free ED troponin less than 0.03, will trend EKG  normal sinus rhythm Chest x-ray unremarkable Echo pending Telemetry, monitor closely  Type 2 diabetes mellitus with hyperglycemia Uncontrolled, likely due to noncompliance A1c pending Start SSI, Lantus 10 twice daily, Accu-Cheks Monitor closely  Hyponatremia Likely secondary to hyperglycemia, should resolve with correction Daily BMP  AKI Likely due to ?GIB Hold home lisinopril for now Gentle hydration     DVT prophylaxis: SCDs  Code Status: Full  Family Communication: None at bedside  Disposition Plan: Once work-up complete  Consults called: GI  Admission status: Observation    Alma Friendly MD [facility]   If 7PM-7AM, please contact night-coverage [email or web address]   10/02/2017, 6:54 PM

## 2017-10-02 NOTE — ED Notes (Signed)
Pt reports has not been able to take his meds for over a month due to lack of medical insurance. Reports was on metformin for diabetes but does not recall med for blood pressure. Reports blurred vision onset yesterday with mild left sided chest pain described as " pricking pain ". Pt denies blood in urine,stool or emesis. Pt sclera pale. Hgb/hct 7.8/23.0 guiac card done by provider. Pt aware of tentative plan to admit.

## 2017-10-03 ENCOUNTER — Encounter (HOSPITAL_COMMUNITY): Payer: Self-pay | Admitting: Gastroenterology

## 2017-10-03 ENCOUNTER — Observation Stay (HOSPITAL_COMMUNITY): Payer: Self-pay | Admitting: Anesthesiology

## 2017-10-03 ENCOUNTER — Observation Stay (HOSPITAL_BASED_OUTPATIENT_CLINIC_OR_DEPARTMENT_OTHER): Payer: Self-pay

## 2017-10-03 ENCOUNTER — Other Ambulatory Visit: Payer: Self-pay

## 2017-10-03 ENCOUNTER — Encounter (HOSPITAL_COMMUNITY)
Admission: EM | Disposition: A | Discharge status: Home / Self Care | Payer: Self-pay | Source: Home / Self Care | Attending: Internal Medicine

## 2017-10-03 DIAGNOSIS — K921 Melena: Secondary | ICD-10-CM

## 2017-10-03 DIAGNOSIS — I340 Nonrheumatic mitral (valve) insufficiency: Secondary | ICD-10-CM

## 2017-10-03 DIAGNOSIS — D62 Acute posthemorrhagic anemia: Secondary | ICD-10-CM

## 2017-10-03 DIAGNOSIS — K259 Gastric ulcer, unspecified as acute or chronic, without hemorrhage or perforation: Secondary | ICD-10-CM

## 2017-10-03 DIAGNOSIS — K228 Other specified diseases of esophagus: Secondary | ICD-10-CM

## 2017-10-03 HISTORY — PX: BIOPSY: SHX5522

## 2017-10-03 HISTORY — PX: ESOPHAGOGASTRODUODENOSCOPY (EGD) WITH PROPOFOL: SHX5813

## 2017-10-03 LAB — BASIC METABOLIC PANEL
ANION GAP: 4 — AB (ref 5–15)
BUN: 24 mg/dL — ABNORMAL HIGH (ref 6–20)
CALCIUM: 7.8 mg/dL — AB (ref 8.9–10.3)
CO2: 24 mmol/L (ref 22–32)
Chloride: 108 mmol/L (ref 98–111)
Creatinine, Ser: 1.82 mg/dL — ABNORMAL HIGH (ref 0.61–1.24)
GFR calc Af Amer: 48 mL/min — ABNORMAL LOW (ref >60)
GFR, EST NON AFRICAN AMERICAN: 41 mL/min — AB (ref >60)
GLUCOSE: 129 mg/dL — AB (ref 70–99)
Potassium: 4.1 mmol/L (ref 3.5–5.1)
Sodium: 136 mmol/L (ref 135–145)

## 2017-10-03 LAB — LIPID PANEL
CHOL/HDL RATIO: 4.4 ratio
CHOLESTEROL: 144 mg/dL (ref 0–200)
HDL: 33 mg/dL — ABNORMAL LOW (ref >40)
LDL CALC: 83 mg/dL (ref 0–99)
TRIGLYCERIDES: 139 mg/dL (ref <150)
VLDL: 28 mg/dL (ref 0–40)

## 2017-10-03 LAB — ECHOCARDIOGRAM COMPLETE
Height: 69 in
Weight: 3026.47 oz

## 2017-10-03 LAB — GLUCOSE, CAPILLARY
GLUCOSE-CAPILLARY: 123 mg/dL — AB (ref 70–99)
GLUCOSE-CAPILLARY: 131 mg/dL — AB (ref 70–99)
GLUCOSE-CAPILLARY: 233 mg/dL — AB (ref 70–99)
Glucose-Capillary: 108 mg/dL — ABNORMAL HIGH (ref 70–99)

## 2017-10-03 LAB — HEMOGLOBIN A1C
HEMOGLOBIN A1C: 11 % — AB (ref 4.8–5.6)
MEAN PLASMA GLUCOSE: 269 mg/dL

## 2017-10-03 LAB — KOH PREP

## 2017-10-03 LAB — TROPONIN I

## 2017-10-03 LAB — CBC
HCT: 23.1 % — ABNORMAL LOW (ref 39.0–52.0)
HEMOGLOBIN: 8 g/dL — AB (ref 13.0–17.0)
MCH: 31.9 pg (ref 26.0–34.0)
MCHC: 34.6 g/dL (ref 30.0–36.0)
MCV: 92 fL (ref 78.0–100.0)
Platelets: 287 10*3/uL (ref 150–400)
RBC: 2.51 MIL/uL — ABNORMAL LOW (ref 4.22–5.81)
RDW: 13.5 % (ref 11.5–15.5)
WBC: 8.3 10*3/uL (ref 4.0–10.5)

## 2017-10-03 LAB — PREPARE RBC (CROSSMATCH)

## 2017-10-03 SURGERY — ESOPHAGOGASTRODUODENOSCOPY (EGD) WITH PROPOFOL
Anesthesia: Monitor Anesthesia Care

## 2017-10-03 MED ORDER — SUCRALFATE 1 GM/10ML PO SUSP
1.0000 g | Freq: Three times a day (TID) | ORAL | Status: DC
  Administered 2017-10-03 – 2017-10-04 (×4): 1 g via ORAL
  Filled 2017-10-03 (×5): qty 10

## 2017-10-03 MED ORDER — SODIUM CHLORIDE 0.9 % IV SOLN: INTRAVENOUS | Status: DC

## 2017-10-03 MED ORDER — SODIUM CHLORIDE 0.9% FLUSH
INTRAVENOUS | Status: AC
  Filled 2017-10-03: qty 30

## 2017-10-03 MED ORDER — MEPERIDINE HCL 100 MG/ML IJ SOLN: 6.2500 mg | INTRAMUSCULAR | Status: DC | PRN

## 2017-10-03 MED ORDER — SODIUM CHLORIDE 0.9% IV SOLUTION: Freq: Once | INTRAVENOUS | Status: DC

## 2017-10-03 MED ORDER — HYDROCODONE-ACETAMINOPHEN 7.5-325 MG PO TABS: 1.0000 | ORAL_TABLET | Freq: Once | ORAL | Status: DC | PRN

## 2017-10-03 MED ORDER — LACTATED RINGERS IV SOLN
INTRAVENOUS | Status: DC
  Administered 2017-10-03: 15:00:00 via INTRAVENOUS

## 2017-10-03 MED ORDER — LACTATED RINGERS IV SOLN: INTRAVENOUS | Status: DC

## 2017-10-03 MED ORDER — PROPOFOL 500 MG/50ML IV EMUL
INTRAVENOUS | Status: DC | PRN
  Administered 2017-10-03: 16:00:00 via INTRAVENOUS
  Administered 2017-10-03: 150 ug/kg/min via INTRAVENOUS

## 2017-10-03 MED ORDER — STERILE WATER FOR IRRIGATION IR SOLN
Status: DC | PRN
  Administered 2017-10-03: 100 mL

## 2017-10-03 MED ORDER — PROPOFOL 10 MG/ML IV BOLUS
INTRAVENOUS | Status: DC | PRN
  Administered 2017-10-03 (×3): 40 mg via INTRAVENOUS

## 2017-10-03 MED ORDER — AMLODIPINE BESYLATE 5 MG PO TABS
10.0000 mg | ORAL_TABLET | Freq: Every day | ORAL | Status: DC
  Administered 2017-10-03 – 2017-10-05 (×3): 10 mg via ORAL
  Filled 2017-10-03 (×3): qty 2

## 2017-10-03 MED ORDER — HYDROMORPHONE HCL 1 MG/ML IJ SOLN: 0.2500 mg | INTRAMUSCULAR | Status: DC | PRN

## 2017-10-03 MED ORDER — PROMETHAZINE HCL 25 MG/ML IJ SOLN: 6.2500 mg | INTRAMUSCULAR | Status: DC | PRN

## 2017-10-03 NOTE — Progress Notes (Signed)
Patient's B/P 197/116 and heart rate 102. Hydralazine injection 20 mg administered per PRN order. Will continue to monitor patient.

## 2017-10-03 NOTE — Consult Note (Addendum)
Referring Provider: Alma Friendly, MD Primary Care Physician:  Patient, No Pcp Per Primary Gastroenterologist:  Garfield Cornea, MD  Reason for Consultation:  Melena, heme + stool, anemia  HPI: Paul Merritt is a 52 y.o. male presented to the emergency department with complaints of fatigue, blurred vision, medical noncompliance due to lack of insurance.  He has a history of hypertension, diabetes has been out of his medications.  He presented with complaints of fatigue, blurred vision, left-sided chest pain on the left breast area for the past 1 month, while in the ED was chest pain-free.  Has been taking Aleve twice daily for this for more than a month.  Denies shortness of breath.  No diaphoresis. Denies melena, brbpr. No abd pain, heartburn, constipation, diarrhea.   The ED was found to be in hypertensive crisis, systolic blood pressure in the 190s.  His CBG was over 400.  His hemoglobin was 7.8, 5 months ago was 12.3.  BUN 27, creatinine 1.99, sodium 129.  B12 normal.  Folate slightly low at 5.1.  Ferritin low normal 27, iron slightly low at 30, iron saturation slightly low at 14%, TIBC low at 207.  CT head yesterday normal.  Chest x-ray unremarkable.  Hemoglobin 8 this morning after receiving 2 units of packed red blood cells..  Troponin x2 less than 0.03.  In ED or DRE, stool black, heme +.   Blood pressures overnight have been much better with systolic in the 169 range.   Prior to Admission medications   Medication Sig Start Date End Date Taking? Authorizing Provider  naproxen sodium (ALEVE) 220 MG tablet Take 220-440 mg by mouth daily as needed.   Yes [provider]  lisinopril (PRINIVIL,ZESTRIL) 10 MG tablet Take 1 tablet (10 mg total) by mouth daily. Patient not taking: Reported on 10/02/2017 04/17/17   Carmin Muskrat, MD  metFORMIN (GLUCOPHAGE) 500 MG tablet Take 1 tablet (500 mg total) by mouth 2 (two) times daily. Patient not taking: Reported on 10/02/2017 04/17/17    Carmin Muskrat, MD    Current Facility-Administered Medications  Medication Dose Route Frequency Provider Last Rate Last Dose  . 0.9 %  sodium chloride infusion (Manually program via Guardrails IV Fluids)   Intravenous Once Alma Friendly, MD      . 0.9 %  sodium chloride infusion (Manually program via Guardrails IV Fluids)   Intravenous Once Mahala Menghini, PA-C      . 0.9 %  sodium chloride infusion   Intravenous Continuous Alma Friendly, MD 75 mL/hr at 10/02/17 1851    . acetaminophen (TYLENOL) tablet 650 mg  650 mg Oral Q6H PRN Alma Friendly, MD       Or  . acetaminophen (TYLENOL) suppository 650 mg  650 mg Rectal Q6H PRN Alma Friendly, MD      . albuterol (PROVENTIL) (2.5 MG/3ML) 0.083% nebulizer solution 2.5 mg  2.5 mg Nebulization Q2H PRN Alma Friendly, MD      . hydrALAZINE (APRESOLINE) injection 20 mg  20 mg Intravenous Q6H PRN Alma Friendly, MD      . insulin aspart (novoLOG) injection 0-15 Units  0-15 Units Subcutaneous TID WC Alma Friendly, MD      . insulin aspart (novoLOG) injection 0-5 Units  0-5 Units Subcutaneous QHS Alma Friendly, MD      . insulin glargine (LANTUS) injection 10 Units  10 Units Subcutaneous BID Alma Friendly, MD   10 Units at 10/02/17 2252  .  ondansetron (ZOFRAN) tablet 4 mg  4 mg Oral Q6H PRN Alma Friendly, MD       Or  . ondansetron [facility]) injection 4 mg  4 mg Intravenous Q6H PRN Alma Friendly, MD      . pantoprazole (PROTONIX) 80 mg in sodium chloride 0.9 % 250 mL (0.32 mg/mL) infusion  8 mg/hr Intravenous Continuous Alma Friendly, MD 25 mL/hr at 10/03/17 0301 8 mg/hr at 10/03/17 0301  . [START ON 10/06/2017] pantoprazole (PROTONIX) injection 40 mg  40 mg Intravenous Q12H Alma Friendly, MD      . senna-docusate (Senokot-S) tablet 1 tablet  1 tablet Oral QHS PRN Alma Friendly, MD      . sodium chloride flush (NS) 0.9 % injection 3 mL  3 mL Intravenous Q12H  Alma Friendly, MD   3 mL at 10/03/17 0825    Allergies as of 10/02/2017  . (No Known Allergies)    Past Medical History:  Diagnosis Date  . Diabetes mellitus without complication ([place])   . Hypertension   . Noncompliance     Past Surgical History:  Procedure Laterality Date  . BACK SURGERY      History reviewed. No pertinent family history.  Social History   Socioeconomic History  . Marital status: Divorced    Spouse name: Not on file  . Number of children: Not on file  . Years of education: Not on file  . Highest education level: Not on file  Occupational History  . Not on file  Social Needs  . Financial resource strain: Not on file  . Food insecurity:    Worry: Not on file    Inability: Not on file  . Transportation needs:    Medical: Not on file    Non-medical: Not on file  Tobacco Use  . Smoking status: Never Smoker  . Smokeless tobacco: Never Used  Substance and Sexual Activity  . Alcohol use: Yes    Comment: occasional heavy use  . Drug use: Never  . Sexual activity: Not on file  Lifestyle  . Physical activity:    Days per week: Not on file    Minutes per session: Not on file  . Stress: Not on file  Relationships  . Social connections:    Talks on phone: Not on file    Gets together: Not on file    Attends religious service: Not on file    Active member of club or organization: Not on file    Attends meetings of clubs or organizations: Not on file    Relationship status: Not on file  . Intimate partner violence:    Fear of current or ex partner: Not on file    Emotionally abused: Not on file    Physically abused: Not on file    Forced sexual activity: Not on file  Other Topics Concern  . Not on file  Social History Narrative  . Not on file     ROS:  General: Negative for anorexia, weight loss, fever, chills, fatigue, ++weakness. Eyes: Negative for vision changes.  ENT: Negative for hoarseness, difficulty swallowing , nasal  congestion. CV: Negative for angina, palpitations, dyspnea on exertion, peripheral edema.  Respiratory: Negative for dyspnea at rest, dyspnea on exertion, cough, sputum, wheezing.  GI: See history of present illness. GU:  Negative for dysuria, hematuria, urinary incontinence, urinary frequency, nocturnal urination.  MS: Negative for joint pain, low back pain.  Derm: Negative for rash or itching.  Neuro: Negative for weakness, abnormal sensation, seizure, frequent headaches, memory loss, confusion.  Psych: Negative for anxiety, depression, suicidal ideation, hallucinations.  Endo: Negative for unusual weight change.  Heme: Negative for bruising or bleeding. Allergy: Negative for rash or hives.       Physical Examination: Vital signs in last 24 hours: Temp:  [97.7 F (36.5 C)-98.9 F (37.2 C)] 98.9 F (37.2 C) (09/26 0256) Pulse Rate:  [86-98] 93 (09/26 0256) Resp:  [13-20] 20 (09/26 0040) BP: (144-195)/(80-111) 158/92 (09/26 0256) SpO2:  [99 %-100 %] 100 % (09/26 0256) Weight:  [85.8 kg-90.7 kg] 85.8 kg (09/25 1827) Last BM Date: 09/30/17  General: Well-nourished, well-developed in no acute distress.  Head: Normocephalic, atraumatic.   Eyes: Conjunctiva pink, no icterus. Mouth: Oropharyngeal mucosa moist and pink , no lesions erythema or exudate. Neck: Supple without thyromegaly, masses, or lymphadenopathy.  Lungs: Clear to auscultation bilaterally.  Heart: Regular rate and rhythm, no murmurs rubs or gallops.  Abdomen: Bowel sounds are normal, nontender, nondistended, no hepatosplenomegaly or masses, no abdominal bruits or    hernia , no rebound or guarding.   Rectal: done in ed Extremities: No lower extremity edema, clubbing, deformity.  Neuro: Alert and oriented x 4 , grossly normal neurologically.  Skin: Warm and dry, no rash or jaundice.   Psych: Alert and cooperative, normal mood and affect.        Intake/Output from previous day: 09/25 0701 - 09/26 0700 In: 1349.1  [I.V.:637.2; Blood:652.9; IV Piggyback:59] Out: -  Intake/Output this shift: No intake/output data recorded.  Lab Results: CBC Recent Labs    10/02/17 1232 10/03/17 0411  WBC 7.3 8.3  HGB 7.8* 8.0*  HCT 23.0* 23.1*  MCV 94.3 92.0  PLT 347 287   BMET Recent Labs    10/02/17 1232 10/03/17 0411  NA 129* 136  K 5.0 4.1  CL 101 108  CO2 24 24  GLUCOSE 431* 129*  BUN 27* 24*  CREATININE 1.99* 1.82*  CALCIUM 8.2* 7.8*   LFT No results for input(s): BILITOT, BILIDIR, IBILI, ALKPHOS, AST, ALT, PROT, ALBUMIN in the last 72 hours.  Lipase No results for input(s): LIPASE in the last 72 hours.  PT/INR No results for input(s): LABPROT, INR in the last 72 hours.    Imaging Studies: Dg Chest 2 View  Result Date: 10/02/2017 CLINICAL DATA:  Blurry vision and hypertension. EXAM: CHEST - 2 VIEW COMPARISON:  None. FINDINGS: The heart size and mediastinal contours are within normal limits. Both lungs are clear. Incompletely visualized spinal rod with right convex scoliosis. IMPRESSION: No active cardiopulmonary disease. Electronically Signed   By: Ulyses Jarred M.D.   On: 10/02/2017 16:32   Ct Head Wo Contrast  Result Date: 10/02/2017 CLINICAL DATA:  Blurry vision. EXAM: CT HEAD WITHOUT CONTRAST TECHNIQUE: Contiguous axial images were obtained from the base of the skull through the vertex without intravenous contrast. COMPARISON:  CT scan of March 06, 2010. FINDINGS: Brain: No evidence of acute infarction, hemorrhage, hydrocephalus, extra-axial collection or mass lesion/mass effect. Vascular: No hyperdense vessel or unexpected calcification. Skull: Normal. Negative for fracture or focal lesion. Sinuses/Orbits: No acute finding. Other: None. IMPRESSION: Normal head CT. Electronically Signed   By: Marijo Conception, M.D.   On: 10/02/2017 16:37  [4 week]   Impression: 52 y/o male presenting with blurred vision, progressive weakness found to be in hypertensive crisis with profound anemia.  Melena, heme + stool on exam in setting of regular Aleve use. Patient has occasional heavy  etoh use but not on regular basis. No evidence of cirrhosis on CT imaging in 04/2017. Suspect UGI bleed due to PUD.   Received 2 units of prbcs without significant improvement of Hgb.   BP much improved this morning.  Plan: 1. EGD with propofol today.  I have discussed the risks, alternatives, benefits with regards to but not limited to the risk of reaction to medication, bleeding, infection, perforation and the patient is agreeable to proceed. Written consent to be obtained. 2. T+C 2 additional units prbcs.  3. Continue Protonix drip.  4. Colonoscopy for screening at later date as outpatient.   We would like to thank you for the opportunity to participate in the care of [employer].  Laureen Ochs. Bernarda Caffey [facility] [phone] 9/26/201911:11 AM     LOS: 0 days

## 2017-10-03 NOTE — Care Management Note (Signed)
Case Management Note  Patient Details  Name: Lin Glazier MRN: [id number] Date of Birth: 03-Oct-1965      Admitted with uncontrolled HTN and DM and anemia. Pt from home, he lives and works in [place], he has no PCP listed. He has no insurance, he has transportation. He was seen in [facility] ED in April and given Rx for metformin and lisinopril. Pt has no followed up with any one since then. Pt's medications are on the $4 list. Discussed with pt options for PCP care in [facility] with no insurance. Pt says he "thinkins he has a PCP but has to make a phone call". CM will provide pt with resource packing, including options for care providers and the [facility] $4 list.  No further CM needs at this time. CM will sign off, may be re-consulted if needed.               Expected Discharge Date:  10/03/17               Expected Discharge Plan:  Home/Self Care  In-House Referral:  Financial Counselor  Discharge planning Services  CM Consult  Post Acute Care Choice:  NA Choice offered to:  NA  Status of Service:  Completed, signed off   Sherald Barge, RN 10/03/2017, 11:43 AM

## 2017-10-03 NOTE — Progress Notes (Signed)
PROGRESS NOTE  Paul Merritt LOV:[id number] DOB: 29-Sep-1965 DOA: 10/02/2017 PCP: Patient, No Pcp Per  HPI/Recap of past 24 hours: Paul Merritt is a 52 y.o. male with medical history significant for hypertension, diabetes, medication noncompliance, presents to the ED complaining of generalized fatigue, blurred vision, and left-sided chest pain. Left-sided chest pain which he describes as pain on the left breast has been ongoing intermittently for about 1 month, of which he has been taking Aleve twice daily for about a month.  No radiation, no associated shortness of breath. Currently denies any left-sided chest pain.  For the past few days, patient has been reporting fatigue, with some blurry vision.  Patient denies any abdominal pain, nausea/vomiting, diaphoresis, dizziness, melena, BRBPR, hematemesis.  Of note, patient reports he has not had his blood pressure medication or diabetes medication for about a month as he could not afford to buy them and does not have a PCP. In the ED, pt was noted to be in hypertensive crisis, SBP in 190s, CBG also noted to be in the 400s, hyponatremia, AKI, and acute blood loss anemia with hemoglobin of 7.8, 5 months ago it was about 12.3.  Patient admitted for further management.   Today, pt denies any new complaints. Denies any chest pain, SOB, abdominal pain, N/V/D/C, BRBPR.  Assessment/Plan: Principal Problem:   Hypertensive crisis Active Problems:   GI bleed   Acute blood loss anemia   Uncontrolled diabetes mellitus (HCC)   Hyponatremia   Noncompliance   AKI (acute kidney injury) ([place])   Melena  Hypertensive crisis Likely secondary to noncompliance CT head negative UDS negative Start amlodipine, Hydralazine IV as needed, hold home lisinopril for now due to AKI  Acute drop in hemoglobin likely 2/2 upper GI bleed Daily use of Aleve for about 1 month, denies melena, BRBPR Hemoglobin 7.8 on presentation, baseline normal FOBT positive S/P transfusion  of 2U of PRBC--> hgb ~8 Continue Protonix drip, gentle hydration Serial CBC GI consulted, plan for EGD today  Chest pain/demand ischemia from acute blood loss Likely non-cardiac, currently chest pain-free ED troponin <0.03-->  <0.03-->  <0.03 EKG normal sinus rhythm Chest x-ray unremarkable Echo with EF of 60%-65%, grade1DD Telemetry, monitor closely  Type 2 diabetes mellitus with hyperglycemia Uncontrolled, likely due to noncompliance A1c 11 Start SSI, Lantus 10 twice daily, Accu-Cheks Monitor closely  Hyponatremia Resolved Likely secondary to hyperglycemia, should resolve with correction Daily BMP  AKI Likely due to ?GIB Hold home lisinopril for now Gentle hydration    Code Status: Full  Family Communication: None at bedside  Disposition Plan: Home once work-up complete   Consultants:  GI  Procedures:  EGD  Antimicrobials:  None  DVT prophylaxis: SCDs   Objective: Vitals:   10/03/17 0256 10/03/17 1329 10/03/17 1424 10/03/17 1436  BP: (!) 158/92 (!) 178/103 (!) 178/106 (!) 171/102  Pulse: 93 97 98 94  Resp:  18 20 20   Temp: 98.9 F (37.2 C) 99.5 F (37.5 C) 98.4 F (36.9 C)   TempSrc: Oral Oral Oral   SpO2: 100% 98% 98%   Weight:      Height:        Intake/Output Summary (Last 24 hours) at 10/03/2017 1528 Last data filed at 10/03/2017 1300 Gross per 24 hour  Intake 1349.07 ml  Output -  Net 1349.07 ml   Filed Weights   10/02/17 1139 10/02/17 1827  Weight: 90.7 kg 85.8 kg    Exam:   General: NAD  Cardiovascular: S1, S2 present  Respiratory: CTA B  Abdomen: Soft, nontender, nondistended, bowel sounds present  Musculoskeletal: Pedal edema bilaterally  Skin: Normal  Psychiatry: Normal mood   Data Reviewed: CBC: Recent Labs  Lab 10/02/17 1232 10/03/17 0411  WBC 7.3 8.3  NEUTROABS 4.4  --   HGB 7.8* 8.0*  HCT 23.0* 23.1*  MCV 94.3 92.0  PLT 347 850   Basic Metabolic Panel: Recent Labs  Lab 10/02/17 1232  10/03/17 0411  NA 129* 136  K 5.0 4.1  CL 101 108  CO2 24 24  GLUCOSE 431* 129*  BUN 27* 24*  CREATININE 1.99* 1.82*  CALCIUM 8.2* 7.8*   GFR: Estimated Creatinine Clearance: 51.5 mL/min (A) (by C-G formula based on SCr of 1.82 mg/dL (H)). Liver Function Tests: No results for input(s): AST, ALT, ALKPHOS, BILITOT, PROT, ALBUMIN in the last 168 hours. No results for input(s): LIPASE, AMYLASE in the last 168 hours. No results for input(s): AMMONIA in the last 168 hours. Coagulation Profile: No results for input(s): INR, PROTIME in the last 168 hours. Cardiac Enzymes: Recent Labs  Lab 10/02/17 1537 10/02/17 1835 10/03/17 0411  TROPONINI <0.03 <0.03 <0.03   BNP (last 3 results) No results for input(s): PROBNP in the last 8760 hours. HbA1C: Recent Labs    10/02/17 1835  HGBA1C 11.0*   CBG: Recent Labs  Lab 10/02/17 1142 10/02/17 1847 10/02/17 2205 10/03/17 0749 10/03/17 1124  GLUCAP 442* 163* 197* 123* 131*   Lipid Profile: Recent Labs    10/03/17 0411  CHOL 144  HDL 33*  LDLCALC 83  TRIG 139  CHOLHDL 4.4   Thyroid Function Tests: No results for input(s): TSH, T4TOTAL, FREET4, T3FREE, THYROIDAB in the last 72 hours. Anemia Panel: Recent Labs    10/02/17 1835  VITAMINB12 208  FOLATE 5.1*  FERRITIN 27  TIBC 207*  IRON 30*   Urine analysis:    Component Value Date/Time   COLORURINE STRAW (A) 10/02/2017 1550   APPEARANCEUR CLEAR 10/02/2017 1550   LABSPEC 1.017 10/02/2017 1550   PHURINE 7.0 10/02/2017 1550   GLUCOSEU >=500 (A) 10/02/2017 1550   HGBUR SMALL (A) 10/02/2017 1550   BILIRUBINUR NEGATIVE 10/02/2017 1550   KETONESUR NEGATIVE 10/02/2017 1550   PROTEINUR >=300 (A) 10/02/2017 1550   NITRITE NEGATIVE 10/02/2017 1550   LEUKOCYTESUR NEGATIVE 10/02/2017 1550   Sepsis Labs: @LABRCNTIP (procalcitonin:4,lacticidven:4)  )No results found for this or any previous visit (from the past 240 hour(s)).    Studies: Dg Chest 2 View  Result Date:  10/02/2017 CLINICAL DATA:  Blurry vision and hypertension. EXAM: CHEST - 2 VIEW COMPARISON:  None. FINDINGS: The heart size and mediastinal contours are within normal limits. Both lungs are clear. Incompletely visualized spinal rod with right convex scoliosis. IMPRESSION: No active cardiopulmonary disease. Electronically Signed   By: Ulyses Jarred M.D.   On: 10/02/2017 16:32   Ct Head Wo Contrast  Result Date: 10/02/2017 CLINICAL DATA:  Blurry vision. EXAM: CT HEAD WITHOUT CONTRAST TECHNIQUE: Contiguous axial images were obtained from the base of the skull through the vertex without intravenous contrast. COMPARISON:  CT scan of March 06, 2010. FINDINGS: Brain: No evidence of acute infarction, hemorrhage, hydrocephalus, extra-axial collection or mass lesion/mass effect. Vascular: No hyperdense vessel or unexpected calcification. Skull: Normal. Negative for fracture or focal lesion. Sinuses/Orbits: No acute finding. Other: None. IMPRESSION: Normal head CT. Electronically Signed   By: Marijo Conception, M.D.   On: 10/02/2017 16:37    Scheduled Meds: . [MAR Hold] sodium chloride   Intravenous  Once  . [MAR Hold] sodium chloride   Intravenous Once  . [MAR Hold] insulin aspart  0-15 Units Subcutaneous TID WC  . [MAR Hold] insulin aspart  0-5 Units Subcutaneous QHS  . [MAR Hold] insulin glargine  10 Units Subcutaneous BID  . [MAR Hold] pantoprazole  40 mg Intravenous Q12H  . [MAR Hold] sodium chloride flush  3 mL Intravenous Q12H    Continuous Infusions: . sodium chloride 75 mL/hr at 10/02/17 1851  . sodium chloride    . lactated ringers 50 mL/hr at 10/03/17 1514  . pantoprozole (PROTONIX) infusion 8 mg/hr (10/03/17 0301)     LOS: 0 days     Alma Friendly, MD [facility]   If 7PM-7AM, please contact night-coverage [email or web address] 10/03/2017, 3:28 PM

## 2017-10-03 NOTE — Op Note (Signed)
[facility] Patient Name: Paul Merritt Procedure Date: 10/03/2017 3:07 PM MRN: [id number] Date of Birth: [birth year] Attending MD: Norvel Richards , MD CSN: [id number] Age: 52 Admit Type: Inpatient Procedure:                Upper GI endoscopy Indications:              Melena Providers:                Norvel Richards, MD, Rosina Lowenstein, RN, Nelma Rothman, Technician Referring MD:              Medicines:                Propofol per Anesthesia Complications:            No immediate complications. Estimated Blood Loss:     Estimated blood loss was minimal. Procedure:                Pre-Anesthesia Assessment:                           - Prior to the procedure, a History and Physical                            was performed, and patient medications and                            allergies were reviewed. The patient's tolerance of                            previous anesthesia was also reviewed. The risks                            and benefits of the procedure and the sedation                            options and risks were discussed with the patient.                            All questions were answered, and informed consent                            was obtained. Prior Anticoagulants: The patient has                            taken no previous anticoagulant or antiplatelet                            agents. ASA Grade Assessment: II - A patient with                            mild systemic disease. After reviewing the risks  and benefits, the patient was deemed in                            satisfactory condition to undergo the procedure.                           After obtaining informed consent, the endoscope was                            passed under direct vision. Throughout the                            procedure, the patient's blood pressure, pulse, and                            oxygen saturations were monitored  continuously. The                            [id number]-H190 ([id number]) scope was introduced through the                            and advanced to the second part of duodenum. The                            upper GI endoscopy was accomplished without                            difficulty. The patient tolerated the procedure                            well. Scope In: 3:34:09 PM Scope Out: 3:42:16 PM Total Procedure Duration: 0 hours 8 minutes 7 seconds  Findings:      Diffuse mucosal changes were found in the entire esophagus. Raised white       plaques over the entire esophagus. Status post KOH brushing.      Many non-bleeding cratered gastric ulcers with pigmented material were       found in the stomach. The largest lesion was 15 mm in largest dimension.       Patient had multiple ulcers in the cardia and body. Some of the       overlying pigment. No actively bleeding lesions seen. 1 of the       clean-based ulcers biopsied for histology. This was biopsied with a cold       forceps for histology. Estimated blood loss was minimal.      The duodenal bulb was normal. Impression:               -Findings suspicious for Candida esophagitis?"status                            post KOH brushing.                           - Non-bleeding gastric ulcers with pigmented  material. Multiple. Biopsied.                           - Normal duodenal bulb. Moderate Sedation:      Moderate (conscious) sedation was personally administered by an       anesthesia professional. The following parameters were monitored: oxygen       saturation, heart rate, blood pressure, respiratory rate, EKG, adequacy       of pulmonary ventilation, and response to care. Total physician       intraservice time was 15 minutes. Recommendation:           - Patient has a contact number available for                            emergencies. The signs and symptoms of potential                            delayed  complications were discussed with the                            patient. Return to normal activities tomorrow.                            Written discharge instructions were provided to the                            patient.                           - Clear liquid diet.                           - Continue present medications. Twice daily PPI.                            Carafate suspension 1 g 4 times daily x5 days                           - Return patient to hospital ward for ongoing care.                           Follow-up on path and KOH brushing. Procedure Code(s):        --- Professional ---                           [phone], Esophagogastroduodenoscopy, flexible,                            transoral; with biopsy, single or multiple Diagnosis Code(s):        --- Professional ---                           K22.8, Other specified diseases of esophagus                           K25.9, Gastric ulcer, unspecified as  acute or                            chronic, without hemorrhage or perforation                           K92.1, Melena (includes Hematochezia) CPT copyright 2017 [employer]. All rights reserved. The codes documented in this report are preliminary and upon coder review may  be revised to meet current compliance requirements. Cristopher Estimable. Rourk, MD Norvel Richards, MD 10/03/2017 3:53:48 PM This report has been signed electronically. Number of Addenda: 0

## 2017-10-03 NOTE — Progress Notes (Signed)
  Echocardiogram 2D Echocardiogram has been performed.  Paul Merritt 10/03/2017, 2:07 PM

## 2017-10-03 NOTE — Anesthesia Postprocedure Evaluation (Signed)
Anesthesia Post Note  Patient: Paul Merritt  Procedure(s) Performed: ESOPHAGOGASTRODUODENOSCOPY (EGD) WITH PROPOFOL (N/A ) BIOPSY ESOPHAGEAL BRUSHING  Patient location during evaluation: PACU Anesthesia Type: MAC Level of consciousness: awake and alert and patient cooperative Pain management: satisfactory to patient Vital Signs Assessment: post-procedure vital signs reviewed and stable Respiratory status: spontaneous breathing Cardiovascular status: stable Postop Assessment: no apparent nausea or vomiting Anesthetic complications: no Comments: BG 108     Last Vitals:  Vitals:   10/03/17 1436 10/03/17 1550  BP: (!) 171/102   Pulse: 94   Resp: 20   Temp:  36.6 C  SpO2:      Last Pain:  Vitals:   10/03/17 1546  TempSrc:   PainSc: 0-No pain                 Corryn Madewell

## 2017-10-03 NOTE — Anesthesia Preprocedure Evaluation (Signed)
Anesthesia Evaluation  Patient identified by MRN, date of birth, ID band Patient awake    Reviewed: Allergy & Precautions, H&P , NPO status , Patient's Chart, lab work & pertinent test results, reviewed documented beta blocker date and time   Airway Mallampati: II  TM Distance: >3 FB Neck ROM: full    Dental no notable dental hx. (+) Upper Dentures, Lower Dentures, Poor Dentition, Dental Advidsory Given   Pulmonary neg pulmonary ROS,    Pulmonary exam normal breath sounds clear to auscultation       Cardiovascular Exercise Tolerance: Good hypertension, On Medications negative cardio ROS   Rhythm:regular Rate:Normal     Neuro/Psych negative neurological ROS  negative psych ROS   GI/Hepatic negative GI ROS, Neg liver ROS,   Endo/Other  negative endocrine ROSdiabetes, Type 2  Renal/GU ARFnegative Renal ROS  negative genitourinary   Musculoskeletal   Abdominal   Peds  Hematology negative hematology ROS (+)   Anesthesia Other Findings Acute blood loss/GI bleed Non compliant of all meds- admit yesterday in HTN crisis, elevated Blood Glu.   States no insurance, no meds  Reproductive/Obstetrics negative OB ROS                             Anesthesia Physical Anesthesia Plan  ASA: IV  Anesthesia Plan: MAC   Post-op Pain Management:    Induction:   PONV Risk Score and Plan:   Airway Management Planned:   Additional Equipment:   Intra-op Plan:   Post-operative Plan:   Informed Consent: I have reviewed the patients History and Physical, chart, labs and discussed the procedure including the risks, benefits and alternatives for the proposed anesthesia with the patient or authorized representative who has indicated his/her understanding and acceptance.   Dental Advisory Given  Plan Discussed with: CRNA and Anesthesiologist  Anesthesia Plan Comments:         Anesthesia Quick  Evaluation

## 2017-10-03 NOTE — Anesthesia Procedure Notes (Signed)
Procedure Name: MAC Date/Time: 10/03/2017 3:24 PM Performed by: Vista Deck, CRNA Pre-anesthesia Checklist: Patient identified, Emergency Drugs available, Suction available, Timeout performed and Patient being monitored Patient Re-evaluated:Patient Re-evaluated prior to induction Oxygen Delivery Method: Nasal Cannula

## 2017-10-03 NOTE — Transfer of Care (Signed)
Immediate Anesthesia Transfer of Care Note  Patient: Stryker Veasey  Procedure(s) Performed: ESOPHAGOGASTRODUODENOSCOPY (EGD) WITH PROPOFOL (N/A ) BIOPSY ESOPHAGEAL BRUSHING  Patient Location: PACU  Anesthesia Type:MAC  Level of Consciousness: awake and patient cooperative  Airway & Oxygen Therapy: Patient Spontanous Breathing  Post-op Assessment: Report given to RN and Post -op Vital signs reviewed and stable  Post vital signs: Reviewed and stable  Last Vitals:  Vitals Value Taken Time  BP    Temp    Pulse 92 10/03/2017  3:51 PM  Resp 18 10/03/2017  3:51 PM  SpO2 95 % 10/03/2017  3:51 PM  Vitals shown include unvalidated device data.  Last Pain:  Vitals:   10/03/17 1546  TempSrc:   PainSc: 0-No pain      Patients Stated Pain Goal: 6 (88/67/73 [id number])  Complications: No apparent anesthesia complications

## 2017-10-04 ENCOUNTER — Observation Stay (HOSPITAL_COMMUNITY): Payer: Self-pay

## 2017-10-04 DIAGNOSIS — E538 Deficiency of other specified B group vitamins: Secondary | ICD-10-CM

## 2017-10-04 DIAGNOSIS — N179 Acute kidney failure, unspecified: Secondary | ICD-10-CM

## 2017-10-04 DIAGNOSIS — R079 Chest pain, unspecified: Secondary | ICD-10-CM

## 2017-10-04 DIAGNOSIS — E1100 Type 2 diabetes mellitus with hyperosmolarity without nonketotic hyperglycemic-hyperosmolar coma (NKHHC): Secondary | ICD-10-CM

## 2017-10-04 DIAGNOSIS — B3781 Candidal esophagitis: Secondary | ICD-10-CM

## 2017-10-04 DIAGNOSIS — D649 Anemia, unspecified: Secondary | ICD-10-CM

## 2017-10-04 DIAGNOSIS — I160 Hypertensive urgency: Secondary | ICD-10-CM

## 2017-10-04 DIAGNOSIS — I169 Hypertensive crisis, unspecified: Secondary | ICD-10-CM

## 2017-10-04 DIAGNOSIS — K259 Gastric ulcer, unspecified as acute or chronic, without hemorrhage or perforation: Secondary | ICD-10-CM | POA: Diagnosis present

## 2017-10-04 DIAGNOSIS — K922 Gastrointestinal hemorrhage, unspecified: Secondary | ICD-10-CM

## 2017-10-04 LAB — GLUCOSE, CAPILLARY
GLUCOSE-CAPILLARY: 142 mg/dL — AB (ref 70–99)
GLUCOSE-CAPILLARY: 237 mg/dL — AB (ref 70–99)
Glucose-Capillary: 161 mg/dL — ABNORMAL HIGH (ref 70–99)
Glucose-Capillary: 161 mg/dL — ABNORMAL HIGH (ref 70–99)

## 2017-10-04 LAB — CBC WITH DIFFERENTIAL/PLATELET
BASOS ABS: 0 10*3/uL (ref 0.0–0.1)
BASOS PCT: 1 %
EOS PCT: 4 %
Eosinophils Absolute: 0.3 10*3/uL (ref 0.0–0.7)
HCT: 27.1 % — ABNORMAL LOW (ref 39.0–52.0)
Hemoglobin: 9.3 g/dL — ABNORMAL LOW (ref 13.0–17.0)
LYMPHS PCT: 25 %
Lymphs Abs: 1.9 10*3/uL (ref 0.7–4.0)
MCH: 31.6 pg (ref 26.0–34.0)
MCHC: 34.3 g/dL (ref 30.0–36.0)
MCV: 92.2 fL (ref 78.0–100.0)
MONO ABS: 0.8 10*3/uL (ref 0.1–1.0)
Monocytes Relative: 10 %
NEUTROS ABS: 4.8 10*3/uL (ref 1.7–7.7)
Neutrophils Relative %: 60 %
PLATELETS: 341 10*3/uL (ref 150–400)
RBC: 2.94 MIL/uL — AB (ref 4.22–5.81)
RDW: 13.9 % (ref 11.5–15.5)
WBC: 7.9 10*3/uL (ref 4.0–10.5)

## 2017-10-04 LAB — SODIUM, URINE, RANDOM: SODIUM UR: 99 mmol/L

## 2017-10-04 LAB — BASIC METABOLIC PANEL
ANION GAP: 4 — AB (ref 5–15)
BUN: 19 mg/dL (ref 6–20)
CALCIUM: 8 mg/dL — AB (ref 8.9–10.3)
CO2: 22 mmol/L (ref 22–32)
Chloride: 109 mmol/L (ref 98–111)
Creatinine, Ser: 1.83 mg/dL — ABNORMAL HIGH (ref 0.61–1.24)
GFR, EST AFRICAN AMERICAN: 47 mL/min — AB (ref >60)
GFR, EST NON AFRICAN AMERICAN: 41 mL/min — AB (ref >60)
Glucose, Bld: 153 mg/dL — ABNORMAL HIGH (ref 70–99)
POTASSIUM: 4.4 mmol/L (ref 3.5–5.1)
Sodium: 135 mmol/L (ref 135–145)

## 2017-10-04 LAB — CREATININE, URINE, RANDOM: CREATININE, URINE: 92.56 mg/dL

## 2017-10-04 LAB — HIV ANTIBODY (ROUTINE TESTING W REFLEX): HIV SCREEN 4TH GENERATION: NONREACTIVE

## 2017-10-04 MED ORDER — CYANOCOBALAMIN 1000 MCG/ML IJ SOLN
1000.0000 ug | Freq: Every day | INTRAMUSCULAR | Status: DC
  Administered 2017-10-04 – 2017-10-05 (×2): 1000 ug via INTRAMUSCULAR
  Filled 2017-10-04 (×2): qty 1

## 2017-10-04 MED ORDER — SODIUM CHLORIDE 0.9 % IV SOLN
INTRAVENOUS | Status: DC
  Administered 2017-10-04: 10:00:00 via INTRAVENOUS

## 2017-10-04 MED ORDER — FLUCONAZOLE 100 MG PO TABS
100.0000 mg | ORAL_TABLET | Freq: Every day | ORAL | Status: DC
  Administered 2017-10-05: 100 mg via ORAL
  Filled 2017-10-04: qty 1

## 2017-10-04 MED ORDER — HYDRALAZINE HCL 25 MG PO TABS
25.0000 mg | ORAL_TABLET | Freq: Three times a day (TID) | ORAL | Status: DC
  Administered 2017-10-04 – 2017-10-05 (×3): 25 mg via ORAL
  Filled 2017-10-04 (×3): qty 1

## 2017-10-04 MED ORDER — PANTOPRAZOLE SODIUM 40 MG PO TBEC
40.0000 mg | DELAYED_RELEASE_TABLET | Freq: Two times a day (BID) | ORAL | Status: DC
  Administered 2017-10-04 – 2017-10-05 (×2): 40 mg via ORAL
  Filled 2017-10-04 (×3): qty 1

## 2017-10-04 MED ORDER — SODIUM CHLORIDE 0.9 % IV SOLN
INTRAVENOUS | Status: DC
  Administered 2017-10-04 – 2017-10-05 (×3): via INTRAVENOUS

## 2017-10-04 MED ORDER — FLUCONAZOLE IN SODIUM CHLORIDE 200-0.9 MG/100ML-% IV SOLN
200.0000 mg | Freq: Once | INTRAVENOUS | Status: AC
  Administered 2017-10-04: 200 mg via INTRAVENOUS
  Filled 2017-10-04: qty 100

## 2017-10-04 MED ORDER — INSULIN ASPART PROT & ASPART (70-30 MIX) 100 UNIT/ML ~~LOC~~ SUSP
12.0000 [IU] | Freq: Two times a day (BID) | SUBCUTANEOUS | Status: DC
  Administered 2017-10-04 – 2017-10-05 (×2): 12 [IU] via SUBCUTANEOUS
  Filled 2017-10-04: qty 10

## 2017-10-04 NOTE — Addendum Note (Signed)
Addendum  created 10/04/17 1042 by Ollen Bowl, CRNA   Sign clinical note

## 2017-10-04 NOTE — Anesthesia Postprocedure Evaluation (Signed)
Anesthesia Post Note  Patient: Paul Merritt  Procedure(s) Performed: ESOPHAGOGASTRODUODENOSCOPY (EGD) WITH PROPOFOL (N/A ) BIOPSY ESOPHAGEAL BRUSHING  Patient location during evaluation: PACU Anesthesia Type: MAC Level of consciousness: awake and alert and oriented Pain management: pain level controlled Vital Signs Assessment: post-procedure vital signs reviewed and stable Respiratory status: spontaneous breathing Cardiovascular status: blood pressure returned to baseline and stable Postop Assessment: no apparent nausea or vomiting Anesthetic complications: no     Last Vitals:  Vitals:   10/03/17 2054 10/04/17 0554  BP:  (!) 152/95  Pulse:  98  Resp:    Temp:  37 C  SpO2: 98% 99%    Last Pain:  Vitals:   10/04/17 0820  TempSrc:   PainSc: 0-No pain                 Elverta Dimiceli

## 2017-10-04 NOTE — Care Management (Signed)
Pt given info for [place] med assist.

## 2017-10-04 NOTE — Progress Notes (Signed)
PROGRESS NOTE    Paul Merritt  WRU:[id number] DOB: 1965-12-16 DOA: 10/02/2017 PCP: Patient, No Pcp Per    Brief Narrative:  Paul Brownis a 52 y.o.malewith medical history significant forhypertension, diabetes, medication noncompliance, presents to the ED complaining of generalized fatigue, blurred vision, and left-sided chest pain. Left-sided chest pain which he describes as pain on the left breast has been ongoing intermittently for about 1 month,of which he has been taking Aleve twice daily for about a month. No radiation, no associated shortness of breath. Currently denies any left-sided chest pain. For the past few days, patient has been reporting fatigue, with some blurry vision. Patient denies any abdominal pain, nausea/vomiting, diaphoresis, dizziness, melena, BRBPR,hematemesis. Of note, patient reports he has not had his blood pressure medication or diabetes medication for about a month as he could not afford to buy them and does not have a PCP. In the ED, ptwas noted to be in hypertensive crisis,SBPin 190s,CBG also noted to be in the 400s,hyponatremia, AKI,and acute blood loss anemia with hemoglobin of 7.8, 5 months ago it was about 12.3.Patient admitted for further management.  Patient subsequently underwent upper endoscopy 10/03/2017 which was consistent with a Candida esophagitis as well as multiple gastric ulcers which were biopsied.   Assessment & Plan:   Principal Problem:   Hypertensive crisis Active Problems:   GI bleed   Acute blood loss anemia   Uncontrolled diabetes mellitus (HCC)   Hyponatremia   Noncompliance   AKI (acute kidney injury) ([place])   Melena   Candida esophagitis ([place]): Probable epr EGD 10/03/2017   Gastric ulcers: Per EGD 10/03/2017   Low vitamin B12 level   Anemia   Chest pain in adult   ARF (acute renal failure) (HCC)   Diabetic hyperosmolar non-ketotic state ([place])   Hypertensive urgency  #1 hypertensive crisis Likely secondary to  medication noncompliance.  Blood pressure improving.  Due to worsening renal function patient's ACE inhibitor on hold.  Patient started on Norvasc 10 mg daily which we will continue for now.  His renal function has improved and creatinine back to baseline may consider resuming ACE inhibitor in the outpatient setting.  Start hydralazine 25 mg 3 times daily.  2.  Acute renal failure On admission creatinine was 1.99.  Concern that worsening renal function may be secondary to a prerenal azotemia however may be secondary to poorly controlled diabetes.  Creatinine slowly trending down.  Continue to hold ACE inhibitor.  Creatinine was 1.27 on 04/17/2017.  Check a fractional excretion of sodium.  Check a renal ultrasound.  Urinalysis done on admission with greater than 300 protein.  Continue gentle hydration and monitor renal function.  Will need outpatient follow-up with PCP.  3.  Poorly controlled type 2 diabetes mellitus Hemoglobin A1c is 11.0.  Poorly controlled diabetes mellitus secondary to medication noncompliance as it was noted on admission that patient was unable to afford his medications and does not have a PCP.  CBG ranging from 142-237.  Due to financial issues will discontinue Lantus 10 and start patient on 70/30  12 units twice daily.  Continue sliding scale insulin.  Will need close outpatient follow-up.  4.  Candida esophagitis Per EGD 10/03/2017.  Likely secondary to poorly controlled diabetes mellitus.  IV Diflucan 200 mg x 1 today then Diflucan 100 mg daily for 14 to 21 days.  HIV PCR nonreactive.  5.  Anemia likely secondary to upper GI bleed secondary to gastric ulcers Patient noted to be anemic on admission with a  hemoglobin of 7.8.  Patient noted to have been using Aleve daily for approximately a month.  Patient denied any melena or bright red blood per rectum.  Status post 2 units packed red blood cells hemoglobin currently at 9.3.  Patient status post upper endoscopy 10/03/2017 noted to  have Candida esophagitis and nonbleeding gastric ulcers which were biopsied.  Biopsies pending.  Continue IV PPI twice daily may transition to oral PPI twice daily per GI recommendations.  Continue Carafate.  Patient also started on Diflucan.  Avoid NSAIDs.  Supportive measures.  Per GI.  6.  Hyponatremia Resolved.  7.  Low vitamin B12 levels Placed on vitamin B12 1000 MCG's IM daily x7 days, and then weekly x1 month, and then monthly.  Outpatient follow-up with PCP.    DVT prophylaxis: SCDs Code Status: Full Family Communication: Updated patient.  No family at bedside. Disposition Plan: Likely home when tolerating oral diet, renal function back to baseline, improvement with glycemic control hopefully in the next 24 to 48 hours.   Consultants:   Gastroenterology: Dr.Rourk 10/03/2017  Procedures:   Upper endoscopy per Dr. Gala Romney 10/03/2017  CT head 10/02/2017  Chest x-ray 10/02/2017  Renal ultrasound pending 10/04/2017  2 units packed red blood cells 9/25, 10/03/2017  Antimicrobials:   None   Subjective: Sitting up in bed.  Tolerated clear liquids.  Will like his diet advanced.  Denies any hematemesis.  Denies any difficulty swallowing.  Denies any abdominal pain.  No chest pain.  No shortness of breath, no headache.  Objective: Vitals:   10/03/17 2038 10/03/17 2054 10/04/17 0554 10/04/17 1309  BP: (!) 154/98  (!) 152/95 (!) 165/99  Pulse: (!) 106  98 97  Resp:    18  Temp: 98.1 F (36.7 C)  98.6 F (37 C) 97.8 F (36.6 C)  TempSrc: Oral  Oral Oral  SpO2: 100% 98% 99% 100%  Weight:      Height:        Intake/Output Summary (Last 24 hours) at 10/04/2017 1741 Last data filed at 10/04/2017 1739 Gross per 24 hour  Intake 560.23 ml  Output -  Net 560.23 ml   Filed Weights   10/02/17 1139 10/02/17 1827  Weight: 90.7 kg 85.8 kg    Examination:  General exam: Appears calm and comfortable  Respiratory system: Clear to auscultation. Respiratory effort  normal. Cardiovascular system: S1 & S2 heard, RRR. No JVD, murmurs, rubs, gallops or clicks. No pedal edema. Gastrointestinal system: Abdomen is nondistended, soft and nontender. No organomegaly or masses felt. Normal bowel sounds heard. Central nervous system: Alert and oriented. No focal neurological deficits. Extremities: Symmetric 5 x 5 Merritt. Skin: No rashes, lesions or ulcers Psychiatry: Judgement and insight appear normal. Mood & affect appropriate.     Data Reviewed: I have personally reviewed following labs and imaging studies  CBC: Recent Labs  Lab 10/02/17 1232 10/03/17 0411 10/04/17 0523  WBC 7.3 8.3 7.9  NEUTROABS 4.4  --  4.8  HGB 7.8* 8.0* 9.3*  HCT 23.0* 23.1* 27.1*  MCV 94.3 92.0 92.2  PLT 347 287 700   Basic Metabolic Panel: Recent Labs  Lab 10/02/17 1232 10/03/17 0411 10/04/17 0523  NA 129* 136 135  K 5.0 4.1 4.4  CL 101 108 109  CO2 24 24 22   GLUCOSE 431* 129* 153*  BUN 27* 24* 19  CREATININE 1.99* 1.82* 1.83*  CALCIUM 8.2* 7.8* 8.0*   GFR: Estimated Creatinine Clearance: 51.2 mL/min (A) (by C-G formula based on SCr  of 1.83 mg/dL (H)). Liver Function Tests: No results for input(s): AST, ALT, ALKPHOS, BILITOT, PROT, ALBUMIN in the last 168 hours. No results for input(s): LIPASE, AMYLASE in the last 168 hours. No results for input(s): AMMONIA in the last 168 hours. Coagulation Profile: No results for input(s): INR, PROTIME in the last 168 hours. Cardiac Enzymes: Recent Labs  Lab 10/02/17 1537 10/02/17 1835 10/03/17 0411  TROPONINI <0.03 <0.03 <0.03   BNP (last 3 results) No results for input(s): PROBNP in the last 8760 hours. HbA1C: Recent Labs    10/02/17 1835  HGBA1C 11.0*   CBG: Recent Labs  Lab 10/03/17 1557 10/03/17 2039 10/04/17 0726 10/04/17 1124 10/04/17 1624  GLUCAP 108* 233* 142* 237* 161*   Lipid Profile: Recent Labs    10/03/17 0411  CHOL 144  HDL 33*  LDLCALC 83  TRIG 139  CHOLHDL 4.4   Thyroid Function  Tests: No results for input(s): TSH, T4TOTAL, FREET4, T3FREE, THYROIDAB in the last 72 hours. Anemia Panel: Recent Labs    10/02/17 1835  VITAMINB12 208  FOLATE 5.1*  FERRITIN 27  TIBC 207*  IRON 30*   Sepsis Labs: No results for input(s): PROCALCITON, LATICACIDVEN in the last 168 hours.  Recent Results (from the past 240 hour(s))  KOH prep     Status: None   Collection Time: 10/03/17  3:36 PM  Result Value Ref Range Status   Specimen Description ESOPHAGUS  Final   Special Requests NONE  Final   KOH Prep   Final    RARE YEAST BUDDING AND HYPHAE PRESENT Performed at [facility], [address] [place], [place] [address]    Report Status 10/03/2017 FINAL  Final         Radiology Studies: US Renal  Result Date: 10/04/2017 CLINICAL DATA:  Acute renal failure EXAM: RENAL / URINARY TRACT ULTRASOUND COMPLETE COMPARISON:  CT 04/17/2017 FINDINGS: Right Kidney: Length: 10.4 cm. Echogenicity within normal limits. No mass or hydronephrosis visualized. Left Kidney: Length: 10.5 cm. Echogenicity within normal limits. No mass or hydronephrosis visualized. Bladder: Appears normal for degree of bladder distention. Incidentally noted are small bilateral effusions. IMPRESSION: No hydronephrosis or acute renal abnormality. Small bilateral pleural effusions. Electronically Signed   By: Rolm Baptise M.D.   On: 10/04/2017 09:34        Scheduled Meds: . sodium chloride   Intravenous Once  . sodium chloride   Intravenous Once  . amLODipine  10 mg Oral Daily  . cyanocobalamin  1,000 mcg Intramuscular Daily  . [START ON 10/05/2017] fluconazole  100 mg Oral Daily  . insulin aspart  0-15 Units Subcutaneous TID WC  . insulin aspart  0-5 Units Subcutaneous QHS  . insulin aspart protamine- aspart  12 Units Subcutaneous BID WC  . pantoprazole  40 mg Oral BID AC  . sodium chloride flush  3 mL Intravenous Q12H   Continuous Infusions: . sodium chloride 100 mL/hr at 10/04/17 1704     LOS: 0 days     Time spent: 40 minutes    Irine Seal, MD [facility] Pager [phone]  If 7PM-7AM, please contact night-coverage [email or web address] Password [facility] 10/04/2017, 5:41 PM

## 2017-10-04 NOTE — Progress Notes (Signed)
Subjective: Denies abdominal pain, N/V. Anxious to go home. No BM in the last 24 hours. Denies any recent hematochezia or melena. No other GI complaints.  Objective: Vital signs in last 24 hours: Temp:  [97.9 F (36.6 C)-99.5 F (37.5 C)] 98.6 F (37 C) (09/27 0554) Pulse Rate:  [92-108] 98 (09/27 0554) Resp:  [18-30] 18 (09/26 1615) BP: (106-205)/(67-116) 152/95 (09/27 0554) SpO2:  [98 %-100 %] 99 % (09/27 0554) Last BM Date: 09/30/17 General:   Alert and oriented, pleasant Head:  Normocephalic and atraumatic. Eyes:  No icterus, sclera clear. Conjuctiva pink.  Heart:  S1, S2 present, no murmurs noted.  Lungs: Clear to auscultation bilaterally, without wheezing, rales, or rhonchi.  Abdomen:  Bowel sounds present, soft, non-tender, non-distended. No HSM or hernias noted. No rebound or guarding. No masses appreciated  Msk:  Symmetrical without gross deformities. Pulses:  Normal pulses noted. Extremities:  Without clubbing or edema. Neurologic:  Alert and  oriented x4;  grossly normal neurologically. Psych:  Alert and cooperative. Normal mood and affect.  Intake/Output from previous day: 09/26 0701 - 09/27 0700 In: 598.8 [P.O.:240; I.V.:358.8] Out: 0  Intake/Output this shift: Total I/O In: 3.7 [I.V.:3.6; IV Piggyback:0.1] Out: -   Lab Results: Recent Labs    10/02/17 1232 10/03/17 0411 10/04/17 0523  WBC 7.3 8.3 7.9  HGB 7.8* 8.0* 9.3*  HCT 23.0* 23.1* 27.1*  PLT 347 287 341   BMET Recent Labs    10/02/17 1232 10/03/17 0411 10/04/17 0523  NA 129* 136 135  K 5.0 4.1 4.4  CL 101 108 109  CO2 24 24 22   GLUCOSE 431* 129* 153*  BUN 27* 24* 19  CREATININE 1.99* 1.82* 1.83*  CALCIUM 8.2* 7.8* 8.0*   LFT No results for input(s): PROT, ALBUMIN, AST, ALT, ALKPHOS, BILITOT, BILIDIR, IBILI in the last 72 hours. PT/INR No results for input(s): LABPROT, INR in the last 72 hours. Hepatitis Panel No results for input(s): HEPBSAG, HCVAB, HEPAIGM, HEPBIGM in the  last 72 hours.   Studies/Results: Dg Chest 2 View  Result Date: 10/02/2017 CLINICAL DATA:  Blurry vision and hypertension. EXAM: CHEST - 2 VIEW COMPARISON:  None. FINDINGS: The heart size and mediastinal contours are within normal limits. Both lungs are clear. Incompletely visualized spinal rod with right convex scoliosis. IMPRESSION: No active cardiopulmonary disease. Electronically Signed   By: Ulyses Jarred M.D.   On: 10/02/2017 16:32   Ct Head Wo Contrast  Result Date: 10/02/2017 CLINICAL DATA:  Blurry vision. EXAM: CT HEAD WITHOUT CONTRAST TECHNIQUE: Contiguous axial images were obtained from the base of the skull through the vertex without intravenous contrast. COMPARISON:  CT scan of March 06, 2010. FINDINGS: Brain: No evidence of acute infarction, hemorrhage, hydrocephalus, extra-axial collection or mass lesion/mass effect. Vascular: No hyperdense vessel or unexpected calcification. Skull: Normal. Negative for fracture or focal lesion. Sinuses/Orbits: No acute finding. Other: None. IMPRESSION: Normal head CT. Electronically Signed   By: Marijo Conception, M.D.   On: 10/02/2017 16:37   US Renal  Result Date: 10/04/2017 CLINICAL DATA:  Acute renal failure EXAM: RENAL / URINARY TRACT ULTRASOUND COMPLETE COMPARISON:  CT 04/17/2017 FINDINGS: Right Kidney: Length: 10.4 cm. Echogenicity within normal limits. No mass or hydronephrosis visualized. Left Kidney: Length: 10.5 cm. Echogenicity within normal limits. No mass or hydronephrosis visualized. Bladder: Appears normal for degree of bladder distention. Incidentally noted are small bilateral effusions. IMPRESSION: No hydronephrosis or acute renal abnormality. Small bilateral pleural effusions. Electronically Signed   By:  Rolm Baptise M.D.   On: 10/04/2017 09:34    Assessment: 52 y/o male presenting with blurred vision, progressive weakness found to be in hypertensive crisis with profound anemia. Melena, heme + stool on exam in setting of regular  Aleve use. Patient has occasional heavy etoh use but not on regular basis. No evidence of cirrhosis on CT imaging in 04/2017.   Received 2 units of prbcs without significant improvement of Hgb.   EGD completed yesterday found esophagus consistent for Candida esophagitis status post KOH pressing, nonbleeding gastric ulcers which were biopsied, normal duodenum.  Recommended clear liquid diet, continue present medications and twice daily PPI, Carafate 4 times a day.  KOH brushing resulted and found rare yeast budding and hyphae present.  Hemoglobin today was significantly improved at 9.3 compared to 8.0 yesterday.  BP remains somewhat elevated today at 152/95.  Plan: 1. Follow biopsies for pathology. 2. Agree with Diflucan based on KOH results 3. Continue PPI twice daily 4. Avoid NSAIDs 5. Monitor H/H 6. Supportive measures   Thank you for allowing [place] to participate in the care of [place], DNP, AGNP-C Adult & Gerontological Nurse Practitioner [facility]     LOS: 0 days    10/04/2017, 1:05 PM

## 2017-10-04 NOTE — Progress Notes (Addendum)
Inpatient Diabetes Program Recommendations  AACE/ADA: New Consensus Statement on Inpatient Glycemic Control (2015)  Target Ranges:  Prepandial:   less than 140 mg/dL      Peak postprandial:   less than 180 mg/dL (1-2 hours)      Critically ill patients:  140 - 180 mg/dL   Results for Paul Merritt, Paul Merritt (MRN [id number]) as of 10/04/2017 15:19  Ref. Range 10/03/2017 07:49 10/03/2017 11:24 10/03/2017 15:57 10/03/2017 20:39  Glucose-Capillary Latest Ref Range: 70 - 99 mg/dL 123 (H) 131 (H)  2 units NOVOLOG 108 (H) 233 (H)  2 units NOVOLOG +  10 units LANTUS   Results for Paul Merritt, Paul Merritt (MRN [id number]) as of 10/04/2017 15:19  Ref. Range 10/04/2017 07:26 10/04/2017 11:24  Glucose-Capillary Latest Ref Range: 70 - 99 mg/dL 142 (H)  2 units NOVOLOG 237 (H)  5 units NOVOLOG    Results for Paul Merritt, Paul Merritt (MRN [id number]) as of 10/04/2017 15:19  Ref. Range 10/02/2017 18:35  Hemoglobin A1C Latest Ref Range: 4.8 - 5.6 % 11.0 (H)  Average glucose 269 mg/dl    Admit with: HTN Crisis/ GIB/ Hyperglycemia  History: DM, Medication Noncompliance  Home DM Meds: Metformin 500 mg BID (NOT taking)  Current Orders: Lantus 10 units BID      Novolog Moderate Correction Scale/ SSI (0-15 units) TID AC + HS    Pr H&P: "Patient reports he has not had his blood pressure medication or diabetes medication for about a month as he could not afford to buy them and does not have a PCP."  NO Insurance/ NO PCP--Care Management has seen patient to assist with finding PCP/ medication assistance.  Underwent Upper ENDO with GI team yesterday.    MD- Note Current Hemoglobin A1c is 11% indicating an average blood glucose level of 269 mg/dl.  Per H&P, patient admits to not taking Metformin prior to admission b/c he ran out and can't afford them.  Does not have PCP.  If you decide to d/c pt on insulin, please keep cost considerations in mind.  May want convert patient to 70/30 Insulin BID with meals at time of discharge--Reli-on  70/30 Insulin can be purchased at [facility] for $25 per vial.  Pt can also purchase inexpensive insulin syringes at [facility] as well.  Will ask nursing staff to begin insulin education with pt in case decision made to d/c pt home on insulin  Reli-On 70/30 Insulin- Order # [id number]  Insulin Syringes- Order # [id number]  Can start with 70/30 Insulin 12 units BID with meals-- This would provide patient with a total of 17 units total basal insulin per day split into two doses    Addendum 4pm- Spoke w/ pt by phone today (DM Coordinator not physically on campus at [place] today).  Pt told me he has taken insulin in the past (vial and syringe).  Stopped insulin and started oral DM meds several years back b/c his CBGs had improved so much (per patient report).  States he is OK and comfortable giving insulin if needed.  Told patient I will have nursing staff check him off on this skill today and tomorrow.  Reminded pt about the importance of rotation of sites.  Reviewed signs and symptoms of Hypoglycemia and how to treat HYPO at home.  Encouraged pt to go to [employer] and purchase ac CBG meter OTC for home use.  Also encouraged pt to purchase glucose tablets (10 tablets are $1 at [facility]) to keep in his pocket as a emergency treatment for HYPO.  Patient  very appreciative of my call.  Will call RN to discuss as well.    --Will follow patient during hospitalization--  Wyn Quaker RN, MSN, CDE Diabetes Coordinator Inpatient Glycemic Control Team Team Pager: [phone] (8a-5p)

## 2017-10-05 LAB — CBC
HCT: 27.6 % — ABNORMAL LOW (ref 39.0–52.0)
Hemoglobin: 9.6 g/dL — ABNORMAL LOW (ref 13.0–17.0)
MCH: 32.2 pg (ref 26.0–34.0)
MCHC: 34.8 g/dL (ref 30.0–36.0)
MCV: 92.6 fL (ref 78.0–100.0)
PLATELETS: 359 10*3/uL (ref 150–400)
RBC: 2.98 MIL/uL — ABNORMAL LOW (ref 4.22–5.81)
RDW: 13.6 % (ref 11.5–15.5)
WBC: 7.7 10*3/uL (ref 4.0–10.5)

## 2017-10-05 LAB — CBC WITH DIFFERENTIAL/PLATELET
BASOS PCT: 1 %
Basophils Absolute: 0 10*3/uL (ref 0.0–0.1)
EOS ABS: 0.4 10*3/uL (ref 0.0–0.7)
EOS PCT: 5 %
HCT: 29 % — ABNORMAL LOW (ref 39.0–52.0)
Hemoglobin: 9.7 g/dL — ABNORMAL LOW (ref 13.0–17.0)
LYMPHS ABS: 2 10*3/uL (ref 0.7–4.0)
Lymphocytes Relative: 26 %
MCH: 31.3 pg (ref 26.0–34.0)
MCHC: 33.4 g/dL (ref 30.0–36.0)
MCV: 93.5 fL (ref 78.0–100.0)
MONOS PCT: 9 %
Monocytes Absolute: 0.7 10*3/uL (ref 0.1–1.0)
NEUTROS PCT: 59 %
Neutro Abs: 4.7 10*3/uL (ref 1.7–7.7)
PLATELETS: 391 10*3/uL (ref 150–400)
RBC: 3.1 MIL/uL — ABNORMAL LOW (ref 4.22–5.81)
RDW: 13.7 % (ref 11.5–15.5)
WBC: 7.8 10*3/uL (ref 4.0–10.5)

## 2017-10-05 LAB — BASIC METABOLIC PANEL
ANION GAP: 6 (ref 5–15)
BUN: 16 mg/dL (ref 6–20)
CALCIUM: 7.9 mg/dL — AB (ref 8.9–10.3)
CO2: 21 mmol/L — ABNORMAL LOW (ref 22–32)
Chloride: 109 mmol/L (ref 98–111)
Creatinine, Ser: 1.99 mg/dL — ABNORMAL HIGH (ref 0.61–1.24)
GFR, EST AFRICAN AMERICAN: 43 mL/min — AB (ref >60)
GFR, EST NON AFRICAN AMERICAN: 37 mL/min — AB (ref >60)
Glucose, Bld: 111 mg/dL — ABNORMAL HIGH (ref 70–99)
Potassium: 4.3 mmol/L (ref 3.5–5.1)
SODIUM: 136 mmol/L (ref 135–145)

## 2017-10-05 LAB — GLUCOSE, CAPILLARY
Glucose-Capillary: 104 mg/dL — ABNORMAL HIGH (ref 70–99)
Glucose-Capillary: 118 mg/dL — ABNORMAL HIGH (ref 70–99)

## 2017-10-05 MED ORDER — CYANOCOBALAMIN 500 MCG PO TABS: 500.0000 ug | ORAL_TABLET | Freq: Every day | ORAL | 0 refills | Status: DC

## 2017-10-05 MED ORDER — HYDRALAZINE HCL 25 MG PO TABS: 25.0000 mg | ORAL_TABLET | Freq: Three times a day (TID) | ORAL | 0 refills | Status: DC

## 2017-10-05 MED ORDER — AMLODIPINE BESYLATE 10 MG PO TABS: 10.0000 mg | ORAL_TABLET | Freq: Every day | ORAL | 0 refills | Status: DC

## 2017-10-05 MED ORDER — GLIPIZIDE 5 MG PO TABS: 5.0000 mg | ORAL_TABLET | Freq: Two times a day (BID) | ORAL | 11 refills | Status: DC

## 2017-10-05 MED ORDER — PANTOPRAZOLE SODIUM 40 MG PO TBEC: 40.0000 mg | DELAYED_RELEASE_TABLET | Freq: Two times a day (BID) | ORAL | 0 refills | Status: DC

## 2017-10-05 MED ORDER — FLUCONAZOLE 100 MG PO TABS: 100.0000 mg | ORAL_TABLET | Freq: Every day | ORAL | 0 refills | Status: DC

## 2017-10-05 NOTE — Discharge Summary (Signed)
Discharge Summary  Paul Merritt QBH:[id number] DOB: 1965/08/22  PCP: Patient, No Pcp Per  Admit date: 10/02/2017 Discharge date: 10/05/2017  Time spent: Less than 30 minutes  Recommendations for Outpatient Follow-up:  1. Gastroenterologist as outpatient   Discharge Diagnoses:  Active Hospital Problems   Diagnosis Date Noted  . Hypertensive crisis 10/02/2017  . Candida esophagitis ([place]): Probable epr EGD 10/03/2017 10/04/2017  . Gastric ulcers: Per EGD 10/03/2017 10/04/2017  . Low vitamin B12 level 10/04/2017  . Anemia   . Chest pain in adult   . ARF (acute renal failure) ([place])   . Diabetic hyperosmolar non-ketotic state ([place])   . Hypertensive urgency   . Melena   . GI bleed 10/02/2017  . Acute blood loss anemia 10/02/2017  . Uncontrolled diabetes mellitus ([place]) 10/02/2017  . Hyponatremia 10/02/2017  . Noncompliance 10/02/2017  . AKI (acute kidney injury) ([place]) 10/02/2017    Resolved Hospital Problems  No resolved problems to display.    Discharge Condition: Improved condition  Diet recommendation: Cardiac diet  Vitals:   10/05/17 0535 10/05/17 0606  BP: (!) 162/100 (!) 158/88  Pulse: 94   Resp: 18   Temp: 98.9 F (37.2 C)   SpO2: 100%     History of present illness:  Patient was admitted with GI bleed hypertensive crisis and uncontrolled diabetes.  Apparently patient had not been taking his medication because he stated he does not have any insurance of primary care provider.  In the emergency room, he was found to have anemia of 7.8 which was low from 5 months ago when it was 12.3.  His current hemoglobin is 9.6.  He was found to be in acute kidney injury and blood sugar was in the 400s with hyponatremia.  He underwent EGD which revealed nonbleeding gastric ulcer disease which was biopsied.  His pathology  report is pending.  He was also found to have Candida esophagitis and was started on Diflucan.  He will complete his Diflucan at home.  He was seen by  gastroenterologist Dr.Field.  He is blood pressure is still uncontrolled but better he is strongly advised to make sure he follows up with his primary care provider regularly and take care of his blood pressure and diabetes take his medications regularly.  He was advised that he could get his medications from the [facility] for the last plan to enable him to keep his diabetes and high blood pressure on the control.  His hyponatremia was corrected with IV fluid the acute kidney injury has improved but has not gone completely to baseline his current creatinine is 1.99.  His metformin and lisinopril were held and continues to be held.  He was started on amlodipine and hydralazine for blood pressure instead.  And I will order glipizide 10 mg twice a day to take as outpatient since he cannot take his metformin anymore He denies any GI bleed nausea or vomiting he is very eager to be discharged home today in improved condition.  Hospital Course:  Principal Problem:   Hypertensive crisis Active Problems:   GI bleed   Acute blood loss anemia   Uncontrolled diabetes mellitus ([facility])   Hyponatremia   Noncompliance   AKI (acute kidney injury) ([place])   Melena   Candida esophagitis ([place]): Probable epr EGD 10/03/2017   Gastric ulcers: Per EGD 10/03/2017   Low vitamin B12 level   Anemia   Chest pain in adult   ARF (acute renal failure) ([place])   Diabetic hyperosmolar non-ketotic state ([place])  Hypertensive urgency   Procedures:  EGD, October 03, 2017  Status post blood transfusion x2 units  Consultations:  Gastroenterology  Discharge Exam: BP (!) 158/88 (BP Location: Right Arm)   Pulse 94   Temp 98.9 F (37.2 C) (Oral)   Resp 18   Ht 5\' 9"  (1.753 m)   Wt 85.8 kg   SpO2 100%   BMI 27.93 kg/m   General: Alert oriented x3 not in any distress Cardiovascular: Heart rate is regular rate and rhythm there is no murmur Respiratory: Clear to auscultation bilaterally Extremity without edema  Discharge  Instructions You were cared for by a hospitalist during your hospital stay. If you have any questions about your discharge medications or the care you received while you were in the hospital after you are discharged, you can call the unit and asked to speak with the hospitalist on call if the hospitalist that took care of you is not available. Once you are discharged, your primary care physician will handle any further medical issues. Please note that NO REFILLS for any discharge medications will be authorized once you are discharged, as it is imperative that you return to your primary care physician (or establish a relationship with a primary care physician if you do not have one) for your aftercare needs so that they can reassess your need for medications and monitor your lab values.  Discharge Instructions    Call MD for:  persistant nausea and vomiting   Complete by:  As directed    Diet - low sodium heart healthy   Complete by:  As directed    Cardia with diabetic diet ,1800 ada, no sweets   Discharge instructions   Complete by:  As directed    Please give f/u appointment with GI Dr fields   Increase activity slowly   Complete by:  As directed    Nursing communication   Complete by:  As directed    Pt should be able to get his bp medication from the $4 list at [place]. And the glipzide too     Allergies as of 10/05/2017   No Known Allergies     Medication List    STOP taking these medications   lisinopril 10 MG tablet Commonly known as:  PRINIVIL,ZESTRIL   metFORMIN 500 MG tablet Commonly known as:  GLUCOPHAGE   naproxen sodium 220 MG tablet Commonly known as:  ALEVE     TAKE these medications   amLODipine 10 MG tablet Commonly known as:  NORVASC Take 1 tablet (10 mg total) by mouth daily. Start taking on:  10/06/2017   fluconazole 100 MG tablet Commonly known as:  DIFLUCAN Take 1 tablet (100 mg total) by mouth daily. Start taking on:  10/06/2017   glipiZIDE 5 MG  tablet Commonly known as:  GLUCOTROL Take 1 tablet (5 mg total) by mouth 2 (two) times daily.   hydrALAZINE 25 MG tablet Commonly known as:  APRESOLINE Take 1 tablet (25 mg total) by mouth every 8 (eight) hours.   pantoprazole 40 MG tablet Commonly known as:  PROTONIX Take 1 tablet (40 mg total) by mouth 2 (two) times daily before a meal.   vitamin B-12 500 MCG tablet Commonly known as:  CYANOCOBALAMIN Take 1 tablet (500 mcg total) by mouth daily.      No Known Allergies    The results of significant diagnostics from this hospitalization (including imaging, microbiology, ancillary and laboratory) are listed below for reference.    Significant Diagnostic Studies:  Dg Chest 2 View  Result Date: 10/02/2017 CLINICAL DATA:  Blurry vision and hypertension. EXAM: CHEST - 2 VIEW COMPARISON:  None. FINDINGS: The heart size and mediastinal contours are within normal limits. Both lungs are clear. Incompletely visualized spinal rod with right convex scoliosis. IMPRESSION: No active cardiopulmonary disease. Electronically Signed   By: Ulyses Jarred M.D.   On: 10/02/2017 16:32   Ct Head Wo Contrast  Result Date: 10/02/2017 CLINICAL DATA:  Blurry vision. EXAM: CT HEAD WITHOUT CONTRAST TECHNIQUE: Contiguous axial images were obtained from the base of the skull through the vertex without intravenous contrast. COMPARISON:  CT scan of March 06, 2010. FINDINGS: Brain: No evidence of acute infarction, hemorrhage, hydrocephalus, extra-axial collection or mass lesion/mass effect. Vascular: No hyperdense vessel or unexpected calcification. Skull: Normal. Negative for fracture or focal lesion. Sinuses/Orbits: No acute finding. Other: None. IMPRESSION: Normal head CT. Electronically Signed   By: Marijo Conception, M.D.   On: 10/02/2017 16:37   US Renal  Result Date: 10/04/2017 CLINICAL DATA:  Acute renal failure EXAM: RENAL / URINARY TRACT ULTRASOUND COMPLETE COMPARISON:  CT 04/17/2017 FINDINGS: Right  Kidney: Length: 10.4 cm. Echogenicity within normal limits. No mass or hydronephrosis visualized. Left Kidney: Length: 10.5 cm. Echogenicity within normal limits. No mass or hydronephrosis visualized. Bladder: Appears normal for degree of bladder distention. Incidentally noted are small bilateral effusions. IMPRESSION: No hydronephrosis or acute renal abnormality. Small bilateral pleural effusions. Electronically Signed   By: Rolm Baptise M.D.   On: 10/04/2017 09:34    Microbiology: Recent Results (from the past 240 hour(s))  KOH prep     Status: None   Collection Time: 10/03/17  3:36 PM  Result Value Ref Range Status   Specimen Description ESOPHAGUS  Final   Special Requests NONE  Final   KOH Prep   Final    RARE YEAST BUDDING AND HYPHAE PRESENT Performed at [facility], [address], [place], [place] [address]    Report Status 10/03/2017 FINAL  Final     Labs: Basic Metabolic Panel: Recent Labs  Lab 10/02/17 1232 10/03/17 0411 10/04/17 0523 10/05/17 0718  NA 129* 136 135 136  K 5.0 4.1 4.4 4.3  CL 101 108 109 109  CO2 24 24 22  21*  GLUCOSE 431* 129* 153* 111*  BUN 27* 24* 19 16  CREATININE 1.99* 1.82* 1.83* 1.99*  CALCIUM 8.2* 7.8* 8.0* 7.9*   Liver Function Tests: No results for input(s): AST, ALT, ALKPHOS, BILITOT, PROT, ALBUMIN in the last 168 hours. No results for input(s): LIPASE, AMYLASE in the last 168 hours. No results for input(s): AMMONIA in the last 168 hours. CBC: Recent Labs  Lab 10/02/17 1232 10/03/17 0411 10/04/17 0523 10/05/17 0718  WBC 7.3 8.3 7.9 7.7  NEUTROABS 4.4  --  4.8  --   HGB 7.8* 8.0* 9.3* 9.6*  HCT 23.0* 23.1* 27.1* 27.6*  MCV 94.3 92.0 92.2 92.6  PLT 347 287 341 359   Cardiac Enzymes: Recent Labs  Lab 10/02/17 1537 10/02/17 1835 10/03/17 0411  TROPONINI <0.03 <0.03 <0.03   BNP: BNP (last 3 results) No results for input(s): BNP in the last 8760 hours.  ProBNP (last 3 results) No results for input(s): PROBNP in the last  8760 hours.  CBG: Recent Labs  Lab 10/04/17 1124 10/04/17 1624 10/04/17 2133 10/05/17 0730 10/05/17 1124  GLUCAP 237* 161* 161* 104* 118*       Signed:  Cristal Deer, MD [facility] 10/05/2017, 1:19 PM

## 2017-10-05 NOTE — Progress Notes (Signed)
Patient ID: Paul Merritt, male   DOB: [birth year], 52 y.o.   MRN: [id number]   Assessment/Plan: ADMITTED WITH MELENA. S/P EGD REVEALING PUD.  PLAN: 1. D/C HOME TODAY LIKELY 2. TCS AS OUPT 3. OPV IN 4 MOS W/ DR. Gala Romney 4. AWAITING PATH REPORT. 5. PROTONIX BID FOR 1 MO THEN ONCE DAILY. 6. DISCUSSED WITH PT IF DOES NOT APPROPRIATELY TREAT HIS DM/HTN HE S GOJG TO END UP ON DIALYSIS. PATIENT VOICED HIS UNDERSTANDING.    Subjective: Since I last evaluated the patient HE HAS NO PAIN, IS TOLERATING POs, AND HAD NO BRBPR OR MELENA.   Objective: Vital signs in last 24 hours: Vitals:   10/05/17 0535 10/05/17 0606  BP: (!) 162/100 (!) 158/88  Pulse: 94   Resp: 18   Temp: 98.9 F (37.2 C)   SpO2: 100%    General appearance: alert, cooperative and no distress Resp: clear to auscultation bilaterally Cardio: regular rate and rhythm GI: soft, non-tender; bowel sounds normal;   Lab Results:  Hb 8.0(9/26)--> 9.6(9/28) Cr 1.82       --> 1.99   Studies/Results: No results found.  Medications: I have reviewed the patient's current medications.

## 2017-10-05 NOTE — Progress Notes (Signed)
Patient discharged home with personal belongings. IV removed and site intact. Patient discharged with prescriptions as well.  

## 2017-10-06 LAB — TYPE AND SCREEN
ABO/RH(D): A POS
Antibody Screen: NEGATIVE
UNIT DIVISION: 0
Unit division: 0
Unit division: 0
Unit division: 0

## 2017-10-06 LAB — BPAM RBC
BLOOD PRODUCT EXPIRATION DATE: 201910242359
BLOOD PRODUCT EXPIRATION DATE: 201910252359
Blood Product Expiration Date: 201910252359
Blood Product Expiration Date: 201910252359
ISSUE DATE / TIME: 201909260022
ISSUE DATE / TIME: 201909252109
UNIT TYPE AND RH: 6200
Unit Type and Rh: 6200
Unit Type and Rh: 6200
Unit Type and Rh: 6200

## 2017-10-07 ENCOUNTER — Encounter (HOSPITAL_COMMUNITY): Payer: Self-pay | Admitting: Internal Medicine

## 2017-10-13 ENCOUNTER — Encounter: Payer: Self-pay | Admitting: Internal Medicine

## 2017-10-15 ENCOUNTER — Telehealth: Payer: Self-pay

## 2017-10-15 NOTE — Telephone Encounter (Signed)
Spoke with pt about his results and treatment needed. Pt was only given 1 Dilflucan pill at the hospital. Pt need to take 1 pill daily x 21 days to treat yeast in esophagus. After yeast therapy, pt will need PrePak or equivalent x 14 days-hold any acid suppression and statin therapy.

## 2017-10-16 NOTE — Telephone Encounter (Signed)
Spoke with pharmacist this morning. They will fill pts medications Diflucan and PrePak for pt. They are aware that pt needs to take Diflucan before taking the Prevpak.

## 2017-10-17 ENCOUNTER — Telehealth: Payer: Self-pay | Admitting: Internal Medicine

## 2017-10-17 NOTE — Telephone Encounter (Signed)
Lmom, waiting on a call.

## 2017-10-17 NOTE — Telephone Encounter (Signed)
Spoke with pt. His medication was very high that will be used to treat his condition per RMR. Will contact pts pharmacy.

## 2017-10-17 NOTE — Telephone Encounter (Signed)
Please call patient regarding his medications. [phone]

## 2017-10-17 NOTE — Telephone Encounter (Signed)
Spoke with [employer]. They are breaking up the prev pak for pt since it was on back order. The Diflucan pill was over $200.00 but when the GoodRx coupon was given, it brought the pill down to $44.00. Pt is able to pay the $44.00 and is aware that he still needs to take the Diflucan first for the next three weeks before starting the prev pak

## 2018-01-08 HISTORY — PX: EYE SURGERY: SHX253

## 2018-01-24 ENCOUNTER — Other Ambulatory Visit: Payer: Self-pay

## 2018-01-24 ENCOUNTER — Emergency Department (HOSPITAL_COMMUNITY)
Admission: EM | Admit: 2018-01-24 | Discharge: 2018-01-24 | Disposition: A | Discharge status: Home / Self Care | Payer: Self-pay | Attending: Emergency Medicine | Admitting: Emergency Medicine

## 2018-01-24 ENCOUNTER — Emergency Department (HOSPITAL_COMMUNITY): Payer: Self-pay

## 2018-01-24 ENCOUNTER — Encounter (HOSPITAL_COMMUNITY): Payer: Self-pay

## 2018-01-24 DIAGNOSIS — I10 Essential (primary) hypertension: Secondary | ICD-10-CM | POA: Insufficient documentation

## 2018-01-24 DIAGNOSIS — R1084 Generalized abdominal pain: Secondary | ICD-10-CM | POA: Insufficient documentation

## 2018-01-24 DIAGNOSIS — R739 Hyperglycemia, unspecified: Secondary | ICD-10-CM

## 2018-01-24 DIAGNOSIS — M47895 Other spondylosis, thoracolumbar region: Secondary | ICD-10-CM | POA: Insufficient documentation

## 2018-01-24 DIAGNOSIS — E119 Type 2 diabetes mellitus without complications: Secondary | ICD-10-CM | POA: Insufficient documentation

## 2018-01-24 DIAGNOSIS — R109 Unspecified abdominal pain: Secondary | ICD-10-CM

## 2018-01-24 DIAGNOSIS — Z79899 Other long term (current) drug therapy: Secondary | ICD-10-CM | POA: Insufficient documentation

## 2018-01-24 DIAGNOSIS — M47815 Spondylosis without myelopathy or radiculopathy, thoracolumbar region: Secondary | ICD-10-CM

## 2018-01-24 DIAGNOSIS — Z7984 Long term (current) use of oral hypoglycemic drugs: Secondary | ICD-10-CM | POA: Insufficient documentation

## 2018-01-24 LAB — URINALYSIS, ROUTINE W REFLEX MICROSCOPIC
Bacteria, UA: NONE SEEN
Bilirubin Urine: NEGATIVE
Ketones, ur: NEGATIVE mg/dL
Leukocytes, UA: NEGATIVE
Nitrite: NEGATIVE
PH: 6 (ref 5.0–8.0)
Protein, ur: >=300 mg/dL — AB
SPECIFIC GRAVITY, URINE: 1.014 (ref 1.005–1.030)

## 2018-01-24 LAB — COMPREHENSIVE METABOLIC PANEL
ALBUMIN: 2.7 g/dL — AB (ref 3.5–5.0)
ALK PHOS: 56 U/L (ref 38–126)
ALT: 23 U/L (ref 0–44)
AST: 19 U/L (ref 15–41)
Anion gap: 5 (ref 5–15)
BILIRUBIN TOTAL: 0.4 mg/dL (ref 0.3–1.2)
BUN: 48 mg/dL — AB (ref 6–20)
CALCIUM: 8.7 mg/dL — AB (ref 8.9–10.3)
CO2: 24 mmol/L (ref 22–32)
CREATININE: 2.74 mg/dL — AB (ref 0.61–1.24)
Chloride: 103 mmol/L (ref 98–111)
GFR calc Af Amer: 29 mL/min — ABNORMAL LOW (ref >60)
GFR, EST NON AFRICAN AMERICAN: 25 mL/min — AB (ref >60)
GLUCOSE: 361 mg/dL — AB (ref 70–99)
POTASSIUM: 4.6 mmol/L (ref 3.5–5.1)
Sodium: 132 mmol/L — ABNORMAL LOW (ref 135–145)
TOTAL PROTEIN: 6.2 g/dL — AB (ref 6.5–8.1)

## 2018-01-24 LAB — CBC WITH DIFFERENTIAL/PLATELET
ABS IMMATURE GRANULOCYTES: 0.01 10*3/uL (ref 0.00–0.07)
BASOS PCT: 0 %
Basophils Absolute: 0 10*3/uL (ref 0.0–0.1)
EOS PCT: 1 %
Eosinophils Absolute: 0.1 10*3/uL (ref 0.0–0.5)
HCT: 31.1 % — ABNORMAL LOW (ref 39.0–52.0)
Hemoglobin: 10.3 g/dL — ABNORMAL LOW (ref 13.0–17.0)
Immature Granulocytes: 0 %
Lymphocytes Relative: 42 %
Lymphs Abs: 2.1 10*3/uL (ref 0.7–4.0)
MCH: 29.9 pg (ref 26.0–34.0)
MCHC: 33.1 g/dL (ref 30.0–36.0)
MCV: 90.4 fL (ref 80.0–100.0)
MONO ABS: 0.5 10*3/uL (ref 0.1–1.0)
MONOS PCT: 10 %
NEUTROS ABS: 2.3 10*3/uL (ref 1.7–7.7)
Neutrophils Relative %: 47 %
PLATELETS: 275 10*3/uL (ref 150–400)
RBC: 3.44 MIL/uL — ABNORMAL LOW (ref 4.22–5.81)
RDW: 13.4 % (ref 11.5–15.5)
WBC: 5 10*3/uL (ref 4.0–10.5)
nRBC: 0 % (ref 0.0–0.2)

## 2018-01-24 LAB — CBG MONITORING, ED: Glucose-Capillary: 195 mg/dL — ABNORMAL HIGH (ref 70–99)

## 2018-01-24 MED ORDER — SODIUM CHLORIDE 0.9 % IV BOLUS
1000.0000 mL | Freq: Once | INTRAVENOUS | Status: AC
  Administered 2018-01-24: 1000 mL via INTRAVENOUS

## 2018-01-24 MED ORDER — AMLODIPINE BESYLATE 10 MG PO TABS: 10.0000 mg | ORAL_TABLET | Freq: Every day | ORAL | 0 refills | Status: DC

## 2018-01-24 MED ORDER — METHOCARBAMOL 500 MG PO TABS: 500.0000 mg | ORAL_TABLET | Freq: Three times a day (TID) | ORAL | 0 refills | Status: DC

## 2018-01-24 MED ORDER — PANTOPRAZOLE SODIUM 20 MG PO TBEC: DELAYED_RELEASE_TABLET | ORAL | 0 refills | Status: DC

## 2018-01-24 MED ORDER — AMLODIPINE BESYLATE 5 MG PO TABS
10.0000 mg | ORAL_TABLET | Freq: Once | ORAL | Status: AC
  Administered 2018-01-24: 10 mg via ORAL
  Filled 2018-01-24: qty 2

## 2018-01-24 NOTE — ED Triage Notes (Signed)
Pt is having bilateral side pain that he states is burning and itching. Pain lingers and has been ongoing for 5 days. No rash visible. States he has never had shingles.

## 2018-01-24 NOTE — ED Notes (Signed)
Pt returned from CT °

## 2018-01-24 NOTE — ED Notes (Signed)
Pt verbalized understanding of no driving and to use caution within 4 hours of taking pain meds due to meds cause drowsiness 

## 2018-01-24 NOTE — ED Provider Notes (Signed)
[facility] EMERGENCY DEPARTMENT Provider Note   CSN: [id number] Arrival date & time: 01/24/18  1037     History   Chief Complaint Chief Complaint  Patient presents with  . Bilateral Side Pain    HPI Paul Merritt is a 53 y.o. male.  Patient is a 53 year old male who presents to the emergency department with bilateral side pain.  The patient states this problem started about 5 days ago.  He describes the pain as pins-and-needles and at times burning and even in each.  The pain comes and goes.  The patient denies any difficulty with urination.  He denies recent injury.  There is been no high fever reported.  Patient does not have a history of kidney stones.  No recent operations or procedures.  It is of note the patient has been diagnosed with gastric ulcers.  He says that he took the medicines that he was given, but he is not on any medications on a regular basis for this.  He has not seen any blood in his stools or changes in his stools recently.    The history is provided by the patient.    Past Medical History:  Diagnosis Date  . Diabetes mellitus without complication ([place])   . Hypertension   . Noncompliance     Patient Active Problem List   Diagnosis Date Noted  . Candida esophagitis ([place]): Probable epr EGD 10/03/2017 10/04/2017  . Gastric ulcers: Per EGD 10/03/2017 10/04/2017  . Low vitamin B12 level 10/04/2017  . Anemia   . Chest pain in adult   . ARF (acute renal failure) ([place])   . Diabetic hyperosmolar non-ketotic state ([place])   . Hypertensive urgency   . Melena   . GI bleed 10/02/2017  . Acute blood loss anemia 10/02/2017  . Hypertensive crisis 10/02/2017  . Uncontrolled diabetes mellitus ([place]) 10/02/2017  . Hyponatremia 10/02/2017  . Noncompliance 10/02/2017  . AKI (acute kidney injury) ([place]) 10/02/2017    Past Surgical History:  Procedure Laterality Date  . BACK SURGERY    . BIOPSY  10/03/2017   Procedure: BIOPSY;  Surgeon: Daneil Dolin, MD;   Location: [facility];  Service: Endoscopy;;  bx of gastric ulcers  . ESOPHAGOGASTRODUODENOSCOPY (EGD) WITH PROPOFOL N/A 10/03/2017   Procedure: ESOPHAGOGASTRODUODENOSCOPY (EGD) WITH PROPOFOL;  Surgeon: Daneil Dolin, MD;  Location: [facility];  Service: Endoscopy;  Laterality: N/A;        Home Medications    Prior to Admission medications   Medication Sig Start Date End Date Taking? Authorizing Provider  amLODipine (NORVASC) 10 MG tablet Take 1 tablet (10 mg total) by mouth daily. 10/06/17   Cristal Deer, MD  fluconazole (DIFLUCAN) 100 MG tablet Take 1 tablet (100 mg total) by mouth daily. 10/06/17   Cristal Deer, MD  glipiZIDE (GLUCOTROL) 5 MG tablet Take 1 tablet (5 mg total) by mouth 2 (two) times daily. 10/05/17 10/05/18  Cristal Deer, MD  hydrALAZINE (APRESOLINE) 25 MG tablet Take 1 tablet (25 mg total) by mouth every 8 (eight) hours. 10/05/17   Cristal Deer, MD  pantoprazole (PROTONIX) 40 MG tablet Take 1 tablet (40 mg total) by mouth 2 (two) times daily before a meal. 10/05/17   Cristal Deer, MD  vitamin B-12 (CYANOCOBALAMIN) 500 MCG tablet Take 1 tablet (500 mcg total) by mouth daily. 10/05/17   Cristal Deer, MD    Family History No family history on file.  Social History Social History   Tobacco Use  . Smoking  status: Never Smoker  . Smokeless tobacco: Never Used  Substance Use Topics  . Alcohol use: Not Currently    Comment: Quit drinking alcohol  . Drug use: Never     Allergies   Patient has no known allergies.   Review of Systems Review of Systems  Constitutional: Negative for activity change.       All ROS Neg except as noted in HPI  HENT: Negative for nosebleeds.   Eyes: Negative for photophobia and discharge.  Respiratory: Negative for cough, shortness of breath and wheezing.   Cardiovascular: Negative for chest pain and palpitations.  Gastrointestinal: Negative for abdominal pain and blood in stool.  Genitourinary: Positive for  flank pain. Negative for dysuria, frequency and hematuria.  Musculoskeletal: Negative for arthralgias, back pain and neck pain.  Skin: Negative.  Negative for rash.  Neurological: Negative for dizziness, seizures and speech difficulty.  Psychiatric/Behavioral: Negative for confusion and hallucinations.     Physical Exam Updated Vital Signs BP (!) 192/122 (BP Location: Right Arm)   Pulse 84   Temp 98 F (36.7 C) (Oral)   Resp 18   Ht 5\' 9"  (1.753 m)   Wt 77.1 kg   SpO2 100%   BMI 25.10 kg/m   Physical Exam Vitals signs and nursing note reviewed.  Constitutional:      Appearance: He is well-developed. He is not toxic-appearing.  HENT:     Head: Normocephalic.     Right Ear: Tympanic membrane and external ear normal.     Left Ear: Tympanic membrane and external ear normal.  Eyes:     General: Lids are normal.     Pupils: Pupils are equal, round, and reactive to light.  Neck:     Musculoskeletal: Normal range of motion and neck supple.     Vascular: No carotid bruit.  Cardiovascular:     Rate and Rhythm: Normal rate and regular rhythm.     Pulses: Normal pulses.     Heart sounds: Normal heart sounds.  Pulmonary:     Effort: No respiratory distress.     Breath sounds: Normal breath sounds.  Abdominal:     General: Bowel sounds are normal.     Palpations: Abdomen is soft.     Tenderness: There is no abdominal tenderness. There is no guarding.     Comments: Right and left CVA tenderness.  Right and left flank pain to palpation and range of motion.  Musculoskeletal: Normal range of motion.  Lymphadenopathy:     Head:     Right side of head: No submandibular adenopathy.     Left side of head: No submandibular adenopathy.     Cervical: No cervical adenopathy.  Skin:    General: Skin is warm and dry.  Neurological:     Mental Status: He is alert and oriented to person, place, and time.     Cranial Nerves: No cranial nerve deficit.     Sensory: No sensory deficit.    Psychiatric:        Speech: Speech normal.      ED Treatments / Results  Labs (all labs ordered are listed, but only abnormal results are displayed) Labs Reviewed  URINALYSIS, ROUTINE W REFLEX MICROSCOPIC - Abnormal; Notable for the following components:      Result Value   Glucose, UA >=500 (*)    Hgb urine dipstick SMALL (*)    Protein, ur >=300 (*)    All other components within normal limits  COMPREHENSIVE METABOLIC PANEL -  Abnormal; Notable for the following components:   Sodium 132 (*)    Glucose, Bld 361 (*)    BUN 48 (*)    Creatinine, Ser 2.74 (*)    Calcium 8.7 (*)    Total Protein 6.2 (*)    Albumin 2.7 (*)    GFR calc non Af Amer 25 (*)    GFR calc Af Amer 29 (*)    All other components within normal limits  CBC WITH DIFFERENTIAL/PLATELET - Abnormal; Notable for the following components:   RBC 3.44 (*)    Hemoglobin 10.3 (*)    HCT 31.1 (*)    All other components within normal limits  POC OCCULT BLOOD, ED  CBG MONITORING, ED    EKG None  Radiology Dg Chest 2 View  Result Date: 01/24/2018 CLINICAL DATA:  Left-sided chest pain EXAM: CHEST - 2 VIEW COMPARISON:  10/02/17 FINDINGS: None cardiac shadows within normal limits. Tortuosity of the thoracic aorta is noted. Stable scoliosis with fixation rod is noted. Lungs are well aerated without focal infiltrate or sizable effusion. IMPRESSION: No acute abnormality noted. Electronically Signed   By: Inez Catalina M.D.   On: 01/24/2018 13:01   Ct Renal Stone Study  Result Date: 01/24/2018 CLINICAL DATA:  Bilateral pain.  Burning and itching. EXAM: CT ABDOMEN AND PELVIS WITHOUT CONTRAST TECHNIQUE: Multidetector CT imaging of the abdomen and pelvis was performed following the standard protocol without IV contrast. COMPARISON:  April 17, 2017 FINDINGS: Lower chest: No acute abnormality. Hepatobiliary: No focal liver abnormality is seen. No gallstones, gallbladder wall thickening, or biliary dilatation. Pancreas:  Unremarkable. No pancreatic ductal dilatation or surrounding inflammatory changes. Spleen: Normal in size without focal abnormality. Adrenals/Urinary Tract: Adrenal glands are unremarkable. Kidneys are normal, without renal calculi, focal lesion, or hydronephrosis. Bladder is unremarkable. Stomach/Bowel: Stomach is within normal limits. Appendix appears normal. No evidence of bowel wall thickening, distention, or inflammatory changes. Vascular/Lymphatic: No significant vascular findings are present. No enlarged abdominal or pelvic lymph nodes. Reproductive: Prostate is unremarkable. Other: No abdominal wall hernia or abnormality. No abdominopelvic ascites. Musculoskeletal: Scoliotic curvature of the thoracolumbar spine with a surgically placed rod. Degenerative changes in the lower lumbar spine. IMPRESSION: 1. No cause for the patient's symptoms identified. No acute abnormalities. 2. Scoliotic and degenerative changes in the thoracic or lumbar spine. Electronically Signed   By: Dorise Bullion III M.D   On: 01/24/2018 14:24    Procedures Procedures (including critical care time)  Medications Ordered in ED Medications  sodium chloride 0.9 % bolus 1,000 mL (1,000 mLs Intravenous New Bag/Given 01/24/18 1450)     Initial Impression / Assessment and Plan / ED Course  I have reviewed the triage vital signs and the nursing notes.  Pertinent labs & imaging results that were available during my care of the patient were reviewed by me and considered in my medical decision making (see chart for details).       Final Clinical Impressions(s) / ED Diagnoses MDM  Blood pressure is elevated at 180/104, otherwise vital signs are within normal limits.  Pulse oximetry is 100% on room air.  Within normal limits by my interpretation.  Patient presents to the emergency department with a complaint of bilateral flank/side pain.  The patient states he is concerned as to whether or not this could be related to his  ulcers.  He says he took medicine for a while, but then stopped taking medication.  He has not been followed closely by his primary physician  because he has been in transition and finding a doctor who he could work out a Agricultural consultant.  No recent injury or trauma to the sides.  No vomiting and no high fever reported.  The comprehensive metabolic panel shows the glucose to be elevated at 361.  The BUN is elevated at 48 and the creatinine is elevated at 2.74.  These values are higher than the patient's previous values at this institution.  An IV has been started and the patient is been given an IV bolus.  The complete blood count shows no acute changes.  Urine analysis shows 500 mg/daL of glucose.  There is greater than 300 mg/daL of protein.  There are 6-10 red blood cells present and hyaline cast noted.  These findings have been discussed with the patient in terms which he understands.  Questions were answered.  Given the patient's pain and red cells present in the urine, will obtain a stone study.  CT stone study is negative for renal calculi or hydronephrosis.  The appendix appears normal.  There is no bowel wall thickening or distention appreciated.  No inflammatory changes appreciated.  The CT scan is read as no acute abnormalities.  There are sclerotic and degenerative changes noted of the thoracic and lumbar spine, otherwise the scan is read as negative.  Patient advised to see the primary physician or return to the emergency department if any changes in condition, problems, or concerns.  Discussed the diagnoses with the patient.  I cautioned the patient of the danger of not keeping control of his diabetes or his blood pressure.  Patient acknowledges understanding of this danger.  Prescription for Norvasc given to the patient is a says he is out of his blood pressure medicine.  Prescription for Robaxin also given to the patient to use for discomfort and spasm.  Patient will see the primary physician  or return to the emergency department if any changes in condition, problems, or concerns.       Final diagnoses:  Bilateral flank pain  Osteoarthritis of thoracolumbar spine, unspecified spinal osteoarthritis complication status  Hyperglycemia  Essential hypertension    ED Discharge Orders         Ordered    amLODipine (NORVASC) 10 MG tablet  Daily     01/24/18 1638    methocarbamol (ROBAXIN) 500 MG tablet  3 times daily     01/24/18 1638    pantoprazole (PROTONIX) 20 MG tablet     01/24/18 1638           Lily Kocher, PA-C 01/25/18 [place], [place], DO 01/26/18 [phone]

## 2018-01-24 NOTE — ED Notes (Signed)
Patient transported to CT 

## 2018-01-24 NOTE — ED Notes (Signed)
Pt with pins and needles pain to left and right side that radiates around, pt states a warm shower helps with pain.  Pt states he first noticed it a week ago. No rash or blisters noted.

## 2018-01-24 NOTE — Discharge Instructions (Signed)
Your blood pressure is elevated.  I have written a prescription for the blood pressure medication you have been missing.  Please take your medications as prescribed.  Please have your pressure rechecked soon.  The lab work and the CT scans suggest that the flank pain may be related to your arthritis in the lumbar and thoracic spine area.  Your glucose was significantly elevated.  It has improved after fluids and medications.  Please monitor your glucose carefully, taking medications as directed.  Your hemoglobin and hematocrit were within a normal range.  There is no evidence of internal bleeding.  Please resume the Protonix to prevent further bleeding.  Please follow-up with your primary physician as soon as possible concerning these multiple medical issues.  Please use Tylenol extra strength every 4 hours and Robaxin 3 times daily for assistance with your pain.  Robaxin may cause drowsiness.  Please use caution getting around when taking this medication.  Please do not drive a vehicle, operate machinery, drink alcohol, or participate in activities requiring concentration when taking this medication. Return to the emergency department if any emergent changes in your condition, problems, or concerns.

## 2018-01-24 NOTE — ED Notes (Signed)
Pt returned from xray

## 2018-11-12 ENCOUNTER — Other Ambulatory Visit: Payer: Self-pay

## 2018-11-12 DIAGNOSIS — Z20822 Contact with and (suspected) exposure to covid-19: Secondary | ICD-10-CM

## 2018-11-13 LAB — NOVEL CORONAVIRUS, NAA: SARS-CoV-2, NAA: NOT DETECTED

## 2019-01-19 ENCOUNTER — Encounter (HOSPITAL_COMMUNITY): Payer: Self-pay

## 2019-01-19 ENCOUNTER — Ambulatory Visit (HOSPITAL_COMMUNITY): Payer: Self-pay

## 2019-01-19 ENCOUNTER — Encounter (HOSPITAL_COMMUNITY)
Admission: RE | Admit: 2019-01-19 | Discharge: 2019-01-19 | Disposition: A | Discharge status: Home / Self Care | Payer: Medicaid Other | Source: Ambulatory Visit | Attending: Nephrology | Admitting: Nephrology

## 2019-01-19 ENCOUNTER — Other Ambulatory Visit: Payer: Self-pay

## 2019-01-19 ENCOUNTER — Other Ambulatory Visit (HOSPITAL_COMMUNITY): Payer: Self-pay

## 2019-01-19 ENCOUNTER — Other Ambulatory Visit (HOSPITAL_COMMUNITY)
Admission: RE | Admit: 2019-01-19 | Discharge: 2019-01-19 | Disposition: A | Discharge status: Home / Self Care | Payer: Medicaid Other | Source: Ambulatory Visit | Attending: Nephrology | Admitting: Nephrology

## 2019-01-19 DIAGNOSIS — N170 Acute kidney failure with tubular necrosis: Secondary | ICD-10-CM | POA: Diagnosis not present

## 2019-01-19 DIAGNOSIS — N185 Chronic kidney disease, stage 5: Secondary | ICD-10-CM | POA: Diagnosis present

## 2019-01-19 DIAGNOSIS — E1122 Type 2 diabetes mellitus with diabetic chronic kidney disease: Secondary | ICD-10-CM | POA: Insufficient documentation

## 2019-01-19 DIAGNOSIS — Z992 Dependence on renal dialysis: Secondary | ICD-10-CM | POA: Insufficient documentation

## 2019-01-19 DIAGNOSIS — R809 Proteinuria, unspecified: Secondary | ICD-10-CM | POA: Diagnosis present

## 2019-01-19 DIAGNOSIS — N186 End stage renal disease: Secondary | ICD-10-CM | POA: Diagnosis present

## 2019-01-19 HISTORY — DX: Anemia, unspecified: D64.9

## 2019-01-19 HISTORY — DX: Gastro-esophageal reflux disease without esophagitis: K21.9

## 2019-01-19 LAB — COMPREHENSIVE METABOLIC PANEL
ALT: 38 U/L (ref 0–44)
AST: 29 U/L (ref 15–41)
Albumin: 2.6 g/dL — ABNORMAL LOW (ref 3.5–5.0)
Alkaline Phosphatase: 69 U/L (ref 38–126)
Anion gap: 7 (ref 5–15)
BUN: 50 mg/dL — ABNORMAL HIGH (ref 6–20)
CO2: 22 mmol/L (ref 22–32)
Calcium: 8.1 mg/dL — ABNORMAL LOW (ref 8.9–10.3)
Chloride: 105 mmol/L (ref 98–111)
Creatinine, Ser: 7.15 mg/dL — ABNORMAL HIGH (ref 0.61–1.24)
GFR calc Af Amer: 9 mL/min — ABNORMAL LOW (ref >60)
GFR calc non Af Amer: 8 mL/min — ABNORMAL LOW (ref >60)
Glucose, Bld: 131 mg/dL — ABNORMAL HIGH (ref 70–99)
Potassium: 5.4 mmol/L — ABNORMAL HIGH (ref 3.5–5.1)
Sodium: 134 mmol/L — ABNORMAL LOW (ref 135–145)
Total Bilirubin: 0.5 mg/dL (ref 0.3–1.2)
Total Protein: 5.8 g/dL — ABNORMAL LOW (ref 6.5–8.1)

## 2019-01-19 LAB — HEPATITIS C ANTIBODY: HCV Ab: NONREACTIVE

## 2019-01-19 LAB — CBC WITH DIFFERENTIAL/PLATELET
Abs Immature Granulocytes: 0.01 10*3/uL (ref 0.00–0.07)
Basophils Absolute: 0 10*3/uL (ref 0.0–0.1)
Basophils Relative: 1 %
Eosinophils Absolute: 0.3 10*3/uL (ref 0.0–0.5)
Eosinophils Relative: 8 %
HCT: 22.8 % — ABNORMAL LOW (ref 39.0–52.0)
Hemoglobin: 7.2 g/dL — ABNORMAL LOW (ref 13.0–17.0)
Immature Granulocytes: 0 %
Lymphocytes Relative: 26 %
Lymphs Abs: 1.1 10*3/uL (ref 0.7–4.0)
MCH: 32 pg (ref 26.0–34.0)
MCHC: 31.6 g/dL (ref 30.0–36.0)
MCV: 101.3 fL — ABNORMAL HIGH (ref 80.0–100.0)
Monocytes Absolute: 0.5 10*3/uL (ref 0.1–1.0)
Monocytes Relative: 13 %
Neutro Abs: 2.2 10*3/uL (ref 1.7–7.7)
Neutrophils Relative %: 52 %
Platelets: 209 10*3/uL (ref 150–400)
RBC: 2.25 MIL/uL — ABNORMAL LOW (ref 4.22–5.81)
RDW: 14.3 % (ref 11.5–15.5)
WBC: 4.2 10*3/uL (ref 4.0–10.5)
nRBC: 0 % (ref 0.0–0.2)

## 2019-01-19 LAB — PROTEIN / CREATININE RATIO, URINE
Creatinine, Urine: 121.82 mg/dL
Protein Creatinine Ratio: 7.37 mg/mg{Cre} — ABNORMAL HIGH (ref 0.00–0.15)
Total Protein, Urine: 898 mg/dL

## 2019-01-19 LAB — VITAMIN D 25 HYDROXY (VIT D DEFICIENCY, FRACTURES): Vit D, 25-Hydroxy: 16.75 ng/mL — ABNORMAL LOW (ref 30–100)

## 2019-01-19 LAB — CREATININE CLEARANCE, URINE, 24 HOUR
Collection Interval-CRCL: 24 hours
Creatinine Clearance: 12 mL/min — ABNORMAL LOW (ref 75–125)
Creatinine, 24H Ur: 1219 mg/d (ref 800–2000)
Creatinine, Urine: 121.85 mg/dL
Urine Total Volume-CRCL: 1000 mL

## 2019-01-19 LAB — HEMOGLOBIN A1C
Hgb A1c MFr Bld: 6.8 % — ABNORMAL HIGH (ref 4.8–5.6)
Mean Plasma Glucose: 148.46 mg/dL

## 2019-01-19 LAB — IRON AND TIBC
Iron: 30 ug/dL — ABNORMAL LOW (ref 45–182)
Saturation Ratios: 14 % — ABNORMAL LOW (ref 17.9–39.5)
TIBC: 212 ug/dL — ABNORMAL LOW (ref 250–450)
UIBC: 182 ug/dL

## 2019-01-19 LAB — URIC ACID: Uric Acid, Serum: 6.2 mg/dL (ref 3.7–8.6)

## 2019-01-19 LAB — HIV ANTIBODY (ROUTINE TESTING W REFLEX): HIV Screen 4th Generation wRfx: NONREACTIVE

## 2019-01-19 LAB — FERRITIN: Ferritin: 53 ng/mL (ref 24–336)

## 2019-01-19 LAB — MAGNESIUM: Magnesium: 2.5 mg/dL — ABNORMAL HIGH (ref 1.7–2.4)

## 2019-01-19 LAB — PHOSPHORUS: Phosphorus: 4.3 mg/dL (ref 2.5–4.6)

## 2019-01-19 LAB — VITAMIN B12: Vitamin B-12: 710 pg/mL (ref 180–914)

## 2019-01-19 LAB — HEPATITIS B SURFACE ANTIGEN: Hepatitis B Surface Ag: NONREACTIVE

## 2019-01-19 MED ORDER — EPOETIN ALFA 3000 UNIT/ML IJ SOLN
INTRAMUSCULAR | Status: AC
  Filled 2019-01-19: qty 1

## 2019-01-19 MED ORDER — EPOETIN ALFA 3000 UNIT/ML IJ SOLN
3000.0000 [IU] | Freq: Once | INTRAMUSCULAR | Status: AC
  Administered 2019-01-19: 3000 [IU] via SUBCUTANEOUS

## 2019-01-20 LAB — PROTEIN ELECTROPHORESIS, SERUM
A/G Ratio: 1 (ref 0.7–1.7)
Albumin ELP: 2.7 g/dL — ABNORMAL LOW (ref 2.9–4.4)
Alpha-1-Globulin: 0.2 g/dL (ref 0.0–0.4)
Alpha-2-Globulin: 0.7 g/dL (ref 0.4–1.0)
Beta Globulin: 0.7 g/dL (ref 0.7–1.3)
Gamma Globulin: 1 g/dL (ref 0.4–1.8)
Globulin, Total: 2.6 g/dL (ref 2.2–3.9)
Total Protein ELP: 5.3 g/dL — ABNORMAL LOW (ref 6.0–8.5)

## 2019-01-20 LAB — UIFE/LIGHT CHAINS/TP QN, 24-HR UR
FR KAPPA LT CH,24HR: 471.11 mg/24 hr
FR LAMBDA LT CH,24HR: 122.29 mg/24 hr
Free Kappa Lt Chains,Ur: 471.11 mg/L — ABNORMAL HIGH (ref 0.63–113.79)
Free Kappa/Lambda Ratio: 3.85 (ref 1.03–31.76)
Free Lambda Lt Chains,Ur: 122.29 mg/L — ABNORMAL HIGH (ref 0.47–11.77)
Total Protein, Urine-Ur/day: 9166 mg/24 hr — ABNORMAL HIGH (ref 30–150)
Total Protein, Urine: 916.6 mg/dL
Total Volume: 1000

## 2019-01-20 LAB — IMMUNOFIXATION, URINE

## 2019-01-22 LAB — C3 COMPLEMENT: C3 Complement: 92 mg/dL (ref 82–167)

## 2019-01-22 LAB — MISC LABCORP TEST (SEND OUT): Labcorp test code: 141330

## 2019-01-22 LAB — KAPPA/LAMBDA LIGHT CHAINS
Kappa free light chain: 197.3 mg/L — ABNORMAL HIGH (ref 3.3–19.4)
Kappa, lambda light chain ratio: 2.27 — ABNORMAL HIGH (ref 0.26–1.65)
Lambda free light chains: 86.8 mg/L — ABNORMAL HIGH (ref 5.7–26.3)

## 2019-01-22 LAB — PTH, INTACT AND CALCIUM
Calcium, Total (PTH): 8.2 mg/dL — ABNORMAL LOW (ref 8.7–10.2)
PTH: 187 pg/mL — ABNORMAL HIGH (ref 15–65)

## 2019-01-22 LAB — RNA QUALITATIVE: HIV 1 RNA Qualitative: 1

## 2019-01-22 LAB — ANCA TITERS
Atypical P-ANCA titer: <1:20 {titer}
C-ANCA: <1:20 {titer}
P-ANCA: <1:20 {titer}

## 2019-01-22 LAB — C4 COMPLEMENT: Complement C4, Body Fluid: 23 mg/dL (ref 12–38)

## 2019-01-22 LAB — ANA: Anti Nuclear Antibody (ANA): NEGATIVE

## 2019-01-22 LAB — GLOMERULAR BASEMENT MEMBRANE ANTIBODIES: GBM Ab: 3 units (ref 0–20)

## 2019-01-22 LAB — HEPATITIS B SURFACE ANTIBODY, QUANTITATIVE: Hep B S AB Quant (Post): 7.6 m[IU]/mL — ABNORMAL LOW (ref >9.9)

## 2019-01-23 LAB — HEPATITIS C GENOTYPE

## 2019-01-27 ENCOUNTER — Other Ambulatory Visit (HOSPITAL_COMMUNITY): Payer: Self-pay

## 2019-01-27 ENCOUNTER — Encounter (HOSPITAL_COMMUNITY): Payer: Self-pay

## 2019-01-27 ENCOUNTER — Encounter: Payer: Self-pay | Admitting: Vascular Surgery

## 2019-01-29 ENCOUNTER — Other Ambulatory Visit: Payer: Self-pay

## 2019-01-29 ENCOUNTER — Other Ambulatory Visit (HOSPITAL_COMMUNITY): Payer: Self-pay | Admitting: Interventional Radiology

## 2019-01-29 ENCOUNTER — Other Ambulatory Visit (HOSPITAL_COMMUNITY)
Admission: RE | Admit: 2019-01-29 | Discharge: 2019-01-29 | Disposition: A | Discharge status: Home / Self Care | Payer: Medicaid Other | Source: Ambulatory Visit | Attending: Nephrology | Admitting: Nephrology

## 2019-01-29 DIAGNOSIS — R69 Illness, unspecified: Secondary | ICD-10-CM | POA: Insufficient documentation

## 2019-01-29 DIAGNOSIS — N186 End stage renal disease: Secondary | ICD-10-CM

## 2019-01-29 LAB — URINALYSIS, COMPLETE (UACMP) WITH MICROSCOPIC
Bacteria, UA: NONE SEEN
Bilirubin Urine: NEGATIVE
Glucose, UA: >=500 mg/dL — AB
Hgb urine dipstick: NEGATIVE
Ketones, ur: NEGATIVE mg/dL
Leukocytes,Ua: NEGATIVE
Nitrite: NEGATIVE
Protein, ur: >=300 mg/dL — AB
Specific Gravity, Urine: 1.01 (ref 1.005–1.030)
pH: 7 (ref 5.0–8.0)

## 2019-01-29 LAB — HEPATITIS B CORE ANTIBODY, IGM: Hep B C IgM: NONREACTIVE

## 2019-01-29 LAB — PROTEIN / CREATININE RATIO, URINE
Creatinine, Urine: 73.62 mg/dL
Protein Creatinine Ratio: 9.11 mg/mg{Cre} — ABNORMAL HIGH (ref 0.00–0.15)
Total Protein, Urine: 671 mg/dL

## 2019-01-30 ENCOUNTER — Other Ambulatory Visit: Payer: Self-pay | Admitting: Radiology

## 2019-02-01 LAB — QUANTIFERON-TB GOLD PLUS: QuantiFERON-TB Gold Plus: NEGATIVE

## 2019-02-01 LAB — QUANTIFERON-TB GOLD PLUS (RQFGPL)
QuantiFERON Mitogen Value: >10 IU/mL
QuantiFERON Nil Value: 0.03 IU/mL
QuantiFERON TB1 Ag Value: 0.04 IU/mL
QuantiFERON TB2 Ag Value: 0.04 IU/mL

## 2019-02-02 ENCOUNTER — Ambulatory Visit (HOSPITAL_COMMUNITY)
Admission: RE | Admit: 2019-02-02 | Discharge: 2019-02-02 | Disposition: A | Discharge status: Home / Self Care | Payer: Medicaid Other | Source: Ambulatory Visit | Attending: Interventional Radiology | Admitting: Interventional Radiology

## 2019-02-02 ENCOUNTER — Encounter (HOSPITAL_COMMUNITY): Payer: Medicaid Other

## 2019-02-02 ENCOUNTER — Encounter (HOSPITAL_COMMUNITY)
Admission: RE | Admit: 2019-02-02 | Discharge: 2019-02-02 | Disposition: A | Discharge status: Home / Self Care | Payer: Medicaid Other | Source: Ambulatory Visit | Attending: Nephrology | Admitting: Nephrology

## 2019-02-02 ENCOUNTER — Encounter (HOSPITAL_COMMUNITY): Payer: Self-pay

## 2019-02-02 ENCOUNTER — Other Ambulatory Visit: Payer: Self-pay

## 2019-02-02 ENCOUNTER — Other Ambulatory Visit (HOSPITAL_COMMUNITY): Payer: Self-pay | Admitting: Interventional Radiology

## 2019-02-02 DIAGNOSIS — N186 End stage renal disease: Secondary | ICD-10-CM | POA: Diagnosis present

## 2019-02-02 DIAGNOSIS — I120 Hypertensive chronic kidney disease with stage 5 chronic kidney disease or end stage renal disease: Secondary | ICD-10-CM | POA: Diagnosis not present

## 2019-02-02 DIAGNOSIS — Z79899 Other long term (current) drug therapy: Secondary | ICD-10-CM | POA: Diagnosis not present

## 2019-02-02 DIAGNOSIS — Z794 Long term (current) use of insulin: Secondary | ICD-10-CM | POA: Diagnosis not present

## 2019-02-02 DIAGNOSIS — K219 Gastro-esophageal reflux disease without esophagitis: Secondary | ICD-10-CM | POA: Insufficient documentation

## 2019-02-02 DIAGNOSIS — Z992 Dependence on renal dialysis: Secondary | ICD-10-CM

## 2019-02-02 DIAGNOSIS — E1122 Type 2 diabetes mellitus with diabetic chronic kidney disease: Secondary | ICD-10-CM | POA: Insufficient documentation

## 2019-02-02 HISTORY — PX: IR FLUORO GUIDE CV LINE RIGHT: IMG2283

## 2019-02-02 HISTORY — PX: IR US GUIDE VASC ACCESS RIGHT: IMG2390

## 2019-02-02 LAB — CBC WITH DIFFERENTIAL/PLATELET
Abs Immature Granulocytes: 0.01 10*3/uL (ref 0.00–0.07)
Basophils Absolute: 0.1 10*3/uL (ref 0.0–0.1)
Basophils Relative: 1 %
Eosinophils Absolute: 0.3 10*3/uL (ref 0.0–0.5)
Eosinophils Relative: 5 %
HCT: 23.5 % — ABNORMAL LOW (ref 39.0–52.0)
Hemoglobin: 7.6 g/dL — ABNORMAL LOW (ref 13.0–17.0)
Immature Granulocytes: 0 %
Lymphocytes Relative: 29 %
Lymphs Abs: 1.4 10*3/uL (ref 0.7–4.0)
MCH: 32.6 pg (ref 26.0–34.0)
MCHC: 32.3 g/dL (ref 30.0–36.0)
MCV: 100.9 fL — ABNORMAL HIGH (ref 80.0–100.0)
Monocytes Absolute: 0.5 10*3/uL (ref 0.1–1.0)
Monocytes Relative: 12 %
Neutro Abs: 2.4 10*3/uL (ref 1.7–7.7)
Neutrophils Relative %: 53 %
Platelets: 235 10*3/uL (ref 150–400)
RBC: 2.33 MIL/uL — ABNORMAL LOW (ref 4.22–5.81)
RDW: 14.6 % (ref 11.5–15.5)
WBC: 4.6 10*3/uL (ref 4.0–10.5)
nRBC: 0 % (ref 0.0–0.2)

## 2019-02-02 LAB — BASIC METABOLIC PANEL
Anion gap: 7 (ref 5–15)
BUN: 52 mg/dL — ABNORMAL HIGH (ref 6–20)
CO2: 20 mmol/L — ABNORMAL LOW (ref 22–32)
Calcium: 8.2 mg/dL — ABNORMAL LOW (ref 8.9–10.3)
Chloride: 110 mmol/L (ref 98–111)
Creatinine, Ser: 7.79 mg/dL — ABNORMAL HIGH (ref 0.61–1.24)
GFR calc Af Amer: 8 mL/min — ABNORMAL LOW (ref >60)
GFR calc non Af Amer: 7 mL/min — ABNORMAL LOW (ref >60)
Glucose, Bld: 140 mg/dL — ABNORMAL HIGH (ref 70–99)
Potassium: 6.1 mmol/L — ABNORMAL HIGH (ref 3.5–5.1)
Sodium: 137 mmol/L (ref 135–145)

## 2019-02-02 LAB — GLUCOSE, CAPILLARY
Glucose-Capillary: 130 mg/dL — ABNORMAL HIGH (ref 70–99)
Glucose-Capillary: 140 mg/dL — ABNORMAL HIGH (ref 70–99)

## 2019-02-02 LAB — PROTIME-INR
INR: 1.1 (ref 0.8–1.2)
Prothrombin Time: 14.3 seconds (ref 11.4–15.2)

## 2019-02-02 MED ORDER — LIDOCAINE HCL 1 % IJ SOLN
INTRAMUSCULAR | Status: AC
  Filled 2019-02-02: qty 20

## 2019-02-02 MED ORDER — GELATIN ABSORBABLE 12-7 MM EX MISC
CUTANEOUS | Status: AC
  Filled 2019-02-02: qty 1

## 2019-02-02 MED ORDER — HYDRALAZINE HCL 25 MG PO TABS
25.0000 mg | ORAL_TABLET | Freq: Once | ORAL | Status: AC
  Administered 2019-02-02: 14:00:00 25 mg via ORAL
  Filled 2019-02-02: qty 1

## 2019-02-02 MED ORDER — SODIUM CHLORIDE 0.9 % IV SOLN
0.3000 ug/kg | Freq: Once | INTRAVENOUS | Status: AC
  Administered 2019-02-02: 16:00:00 28 ug via INTRAVENOUS
  Filled 2019-02-02: qty 7

## 2019-02-02 MED ORDER — FENTANYL CITRATE (PF) 100 MCG/2ML IJ SOLN
INTRAMUSCULAR | Status: AC | PRN
  Administered 2019-02-02: 50 ug via INTRAVENOUS

## 2019-02-02 MED ORDER — CARVEDILOL 12.5 MG PO TABS
12.5000 mg | ORAL_TABLET | Freq: Once | ORAL | Status: AC
  Administered 2019-02-02: 15:00:00 12.5 mg via ORAL
  Filled 2019-02-02: qty 1

## 2019-02-02 MED ORDER — AMLODIPINE BESYLATE 10 MG PO TABS
10.0000 mg | ORAL_TABLET | Freq: Once | ORAL | Status: DC
  Filled 2019-02-02: qty 1

## 2019-02-02 MED ORDER — HEPARIN SODIUM (PORCINE) 1000 UNIT/ML IJ SOLN
INTRAMUSCULAR | Status: AC
  Filled 2019-02-02: qty 1

## 2019-02-02 MED ORDER — MIDAZOLAM HCL 2 MG/2ML IJ SOLN
INTRAMUSCULAR | Status: AC | PRN
  Administered 2019-02-02: 1 mg via INTRAVENOUS

## 2019-02-02 MED ORDER — SODIUM POLYSTYRENE SULFONATE 15 GM/60ML PO SUSP
15.0000 g | Freq: Once | ORAL | Status: AC
  Administered 2019-02-02: 18:00:00 15 g via ORAL
  Filled 2019-02-02: qty 60

## 2019-02-02 MED ORDER — MIDAZOLAM HCL 2 MG/2ML IJ SOLN
INTRAMUSCULAR | Status: AC
  Filled 2019-02-02: qty 2

## 2019-02-02 MED ORDER — CEFAZOLIN SODIUM-DEXTROSE 2-4 GM/100ML-% IV SOLN
2.0000 g | INTRAVENOUS | Status: AC
  Administered 2019-02-02: 2 g via INTRAVENOUS

## 2019-02-02 MED ORDER — CEFAZOLIN SODIUM-DEXTROSE 2-4 GM/100ML-% IV SOLN
INTRAVENOUS | Status: AC
  Filled 2019-02-02: qty 100

## 2019-02-02 MED ORDER — LIDOCAINE HCL 1 % IJ SOLN
INTRAMUSCULAR | Status: AC | PRN
  Administered 2019-02-02: 10 mL

## 2019-02-02 MED ORDER — FENTANYL CITRATE (PF) 100 MCG/2ML IJ SOLN
INTRAMUSCULAR | Status: AC
  Filled 2019-02-02: qty 2

## 2019-02-02 NOTE — Progress Notes (Signed)
Patient with elevated potassium in setting of chronic renal failure.  He is planning to initiate dialysis tomorrow after HD catheter placement today.  Confirmed his appointment with [facility] in [place].  Patient to arrive at 11AM. This information is given to Mr. Ronningen.  Spoke with Nephrologist on call re: potassium on 6.1.  Given stability and asymptomatic with plans for dialysis tomorrow, patient does not need urgent dialysis at this time. Recommended 15g kayexalate which is ordered.  Instructed patient to present to ED if unable to undergo dialysis tomorrow as planned.  Patient voices understanding and restates his plans to go to dialysis tomorrow.   Brynda Greathouse, MS RD PA-C 4:31 PM

## 2019-02-02 NOTE — H&P (Signed)
Chief Complaint: Patient was seen in consultation today for tunneled dialysis catheter placement at the request of Dr Theador Hawthorne   Supervising Physician: Daryll Brod  Patient Status: [facility] - Out-pt  History of Present Illness: Paul Merritt is a 54 y.o. male   CKD stage 5 Worsening renal function Need for dialysis Plan for tunneled dialysis catheter placement today To start dialysis tomorrow per Dr Theador Hawthorne   Past Medical History:  Diagnosis Date  . Anemia   . Diabetes mellitus without complication ([place])   . GERD (gastroesophageal reflux disease)   . Hypertension   . Noncompliance     Past Surgical History:  Procedure Laterality Date  . BACK SURGERY     [place] rod  . BIOPSY  10/03/2017   Procedure: BIOPSY;  Surgeon: Daneil Dolin, MD;  [facility];  Service: Endoscopy;;  bx of gastric ulcers  . ESOPHAGOGASTRODUODENOSCOPY (EGD) WITH PROPOFOL N/A 10/03/2017   Procedure: ESOPHAGOGASTRODUODENOSCOPY (EGD) WITH PROPOFOL;  Surgeon: Daneil Dolin, MD;  [facility];  Service: Endoscopy;  Laterality: N/A;  . EYE SURGERY     retinal detatchment    Allergies: Patient has no known allergies.  Medications: Prior to Admission medications   Medication Sig Start Date End Date Taking? Authorizing Provider  amLODipine (NORVASC) 10 MG tablet Take 1 tablet (10 mg total) by mouth daily. Patient taking differently: Take 5 mg by mouth daily.  01/24/18   Lily Kocher, PA-C  calcium acetate (PHOSLO) 667 MG capsule Take by mouth 3 (three) times daily with meals.    [provider]  carvedilol (COREG) 12.5 MG tablet Take 12.5 mg by mouth 2 (two) times daily with a meal.    [provider]  chlorthalidone (HYGROTON) 25 MG tablet Take 25 mg by mouth daily.    [provider]  fluconazole (DIFLUCAN) 100 MG tablet Take 1 tablet (100 mg total) by mouth daily. 10/06/17   Cristal Deer, MD  furosemide (LASIX) 40 MG tablet Take 40 mg by mouth  2 (two) times daily.    [provider]  glipiZIDE (GLUCOTROL) 5 MG tablet Take 1 tablet (5 mg total) by mouth 2 (two) times daily. 10/05/17 10/05/18  Cristal Deer, MD  hydrALAZINE (APRESOLINE) 25 MG tablet Take 1 tablet (25 mg total) by mouth every 8 (eight) hours. Patient taking differently: Take 50 mg by mouth every 8 (eight) hours.  10/05/17   Cristal Deer, MD  insulin regular (NOVOLIN R) 100 units/mL injection Inject 5 Units into the skin 3 (three) times daily before meals.    [provider]  methocarbamol (ROBAXIN) 500 MG tablet Take 1 tablet (500 mg total) by mouth 3 (three) times daily. 01/24/18   Lily Kocher, PA-C  olmesartan (BENICAR) 20 MG tablet Take 20 mg by mouth daily.    [provider]  pantoprazole (PROTONIX) 20 MG tablet 2 po daily 01/24/18   Lily Kocher, PA-C  terazosin (HYTRIN) 2 MG capsule Take 2 mg by mouth at bedtime.    [provider]  vitamin B-12 (CYANOCOBALAMIN) 500 MCG tablet Take 1 tablet (500 mcg total) by mouth daily. 10/05/17   Cristal Deer, MD     History reviewed. No pertinent family history.  Social History   Socioeconomic History  . Marital status: Divorced    Spouse name: Not on file  . Number of children: Not on file  . Years of education: Not on file  . Highest education level: Not on file  Occupational History  .  Not on file  Tobacco Use  . Smoking status: Never Smoker  . Smokeless tobacco: Never Used  Substance and Sexual Activity  . Alcohol use: Not Currently    Comment: Quit drinking alcohol  . Drug use: Never  . Sexual activity: Not on file  Other Topics Concern  . Not on file  Social History Narrative  . Not on file   Social Determinants of Health   Financial Resource Strain:   . Difficulty of Paying Living Expenses: Not on file  Food Insecurity:   . Worried About Charity fundraiser in the Last Year: Not on file  . Ran Out of Food in the Last Year: Not on file  Transportation  Needs:   . Lack of Transportation (Medical): Not on file  . Lack of Transportation (Non-Medical): Not on file  Physical Activity:   . Days of Exercise per Week: Not on file  . Minutes of Exercise per Session: Not on file  Stress:   . Feeling of Stress : Not on file  Social Connections:   . Frequency of Communication with Friends and Family: Not on file  . Frequency of Social Gatherings with Friends and Family: Not on file  . Attends Religious Services: Not on file  . Active Member of Clubs or Organizations: Not on file  . Attends Archivist Meetings: Not on file  . Marital Status: Not on file     Review of Systems: A 12 point ROS discussed and pertinent positives are indicated in the HPI above.  All other systems are negative.  Review of Systems  Constitutional: Negative for activity change, fatigue and fever.  Respiratory: Negative for cough and shortness of breath.   Cardiovascular: Negative for chest pain.  Neurological: Negative for weakness.  Psychiatric/Behavioral: Negative for behavioral problems and confusion.    Vital Signs: Pulse 83   Temp 98.8 F (37.1 C) (Oral)   Resp 20   Ht 5\' 9"  (1.753 m)   Wt 205 lb (93 kg)   SpO2 96%   BMI 30.27 kg/m   Physical Exam Vitals reviewed.  Cardiovascular:     Rate and Rhythm: Normal rate and regular rhythm.     Heart sounds: Normal heart sounds.  Pulmonary:     Effort: Pulmonary effort is normal.     Breath sounds: Normal breath sounds.  Abdominal:     Palpations: Abdomen is soft.  Musculoskeletal:        General: Normal range of motion.  Skin:    General: Skin is warm and dry.  Neurological:     Mental Status: He is alert and oriented to person, place, and time.  Psychiatric:        Mood and Affect: Mood normal.        Behavior: Behavior normal.        Thought Content: Thought content normal.        Judgment: Judgment normal.     Imaging: No results found.  Labs:  CBC: Recent Labs     01/19/19 1439  WBC 4.2  HGB 7.2*  HCT 22.8*  PLT 209    COAGS: No results for input(s): INR, APTT in the last 8760 hours.  BMP: Recent Labs    01/19/19 1439  NA 134*  K 5.4*  CL 105  CO2 22  GLUCOSE 131*  BUN 50*  CALCIUM 8.1*  8.2*  CREATININE 7.15*  GFRNONAA 8*  GFRAA 9*    LIVER FUNCTION TESTS: Recent Labs  01/19/19 1439  BILITOT 0.5  AST 29  ALT 38  ALKPHOS 69  PROT 5.8*  ALBUMIN 2.6*    TUMOR MARKERS: No results for input(s): AFPTM, CEA, CA199, CHROMGRNA in the last 8760 hours.  Assessment and Plan:  CKD stage 5/ESRD To start dialysis tomorrow Now scheduled for tunneled dialysis catheter placement Risks and benefits discussed with the patient including, but not limited to bleeding, infection, vascular injury, pneumothorax which may require chest tube placement, air embolism or even death  All of the patient's questions were answered, patient is agreeable to proceed. Consent signed and in chart.   Thank you for this interesting consult.  I greatly enjoyed meeting Paul Merritt and look forward to participating in their care.  A copy of this report was sent to the requesting provider on this date.  Electronically Signed: Lavonia Drafts, PA-C 02/02/2019, 1:00 PM   I spent a total of  30 Minutes   in face to face in clinical consultation, greater than 50% of which was counseling/coordinating care for tunneled dialysis catheter placement

## 2019-02-02 NOTE — Procedures (Signed)
ESRD  S/p RT IJ HD CATH  Tip svcra No comp Stable ebl min Ready for use Full report in pacs

## 2019-02-02 NOTE — Discharge Instructions (Signed)
Central Line Dialysis Access Placement, Care After This sheet gives you information about how to care for yourself after your procedure. Your health care provider may also give you more specific instructions. If you have problems or questions, contact your health care provider. What can I expect after the procedure? After the procedure, it is common to have:  Mild pain or discomfort.  Mild redness, swelling, or bruising around your incision.  A small amount of blood or clear fluid coming from your incision. Follow these instructions at home: Incision care   Follow instructions from your health care provider about how to take care of your incision. Make sure you: ? Wash your hands with soap and water before you change your bandage (dressing). If soap and water are not available, use hand sanitizer. ? Change your dressing as told by your health care provider. ? Leave stitches (sutures) in place.  Check your incision area every day for signs of infection. Check for: ? More redness, swelling, or pain. ? More fluid or blood. ? Warmth. ? Pus or a bad smell.  If directed, put heat on the catheter site as often as told by your health care provider. Use the heat source that your health care provider recommends, such as a moist heat pack or a heating pad. ? Place a towel between your skin and the heat source. ? Leave the heat on for 20-30 minutes. ? Remove the heat if your skin turns bright red. This is especially important if you are unable to feel pain, heat, or cold. You may have a greater risk of getting burned.  If directed, put ice on the catheter site: ? Put ice in a plastic bag. ? Place a towel between your skin and the bag. ? Leave the ice on for 20 minutes, 2-3 times a day. Medicines  Take over-the-counter and prescription medicines only as told by your health care provider.  If you were prescribed an antibiotic medicine, use it as told by your health care provider. Do not stop  using the antibiotic even if you start to feel better. Activity  Return to your normal activities as told by your health care provider. Ask your health care provider what activities are safe for you.  Do not lift anything that is heavier than 10 lb (4.5 kg) until your health care provider says that this is safe. Driving  Do not drive for 24 hours if you were given a medicine to help you relax (sedative) during your procedure.  Do not drive or use heavy machinery while taking prescription pain medicine. Lifestyle  Limit alcohol intake to no more than 1 drink a day for nonpregnant women and 2 drinks a day for men. One drink equals 12 oz of beer, 5 oz of wine, or 1 oz of hard liquor.  Do not use any products that contain nicotine or tobacco, such as cigarettes and e-cigarettes. If you need help quitting, ask your health care provider. General instructions  Do not take baths or showers, swim, or use a hot tub until your health care provider approves. You may only be allowed to take sponge baths for bathing.  Wear compression stockings as told by your health care provider. These stockings help to prevent blood clots and reduce swelling in your legs.  Follow instructions from your health care provider about eating or drinking restrictions.  Keep all follow-up visits as told by your health care provider. This is important. Contact a health care provider if:  Your  catheter gets pulled out of place.  Your catheter site becomes itchy.  You develop a rash around your catheter site.  You have more redness, swelling, or pain around your incision.  You have more fluid or blood coming from your incision.  Your incision area feels warm to the touch.  You have pus or a bad smell coming from your incision.  You have a fever. Get help right away if:  You become light-headed or dizzy.  You faint.  You have difficulty breathing.  Your catheter gets pulled out completely. This  information is not intended to replace advice given to you by your health care provider. Make sure you discuss any questions you have with your health care provider. Document Revised: 12/07/2016 Document Reviewed: 09/19/2015 Elsevier Patient Education  2020 [employer].

## 2019-02-23 ENCOUNTER — Other Ambulatory Visit: Payer: Self-pay

## 2019-02-23 ENCOUNTER — Telehealth (HOSPITAL_COMMUNITY): Payer: Self-pay

## 2019-02-23 DIAGNOSIS — N179 Acute kidney failure, unspecified: Secondary | ICD-10-CM

## 2019-02-23 NOTE — Telephone Encounter (Signed)

## 2019-02-24 ENCOUNTER — Other Ambulatory Visit: Payer: Self-pay

## 2019-02-24 ENCOUNTER — Ambulatory Visit (INDEPENDENT_AMBULATORY_CARE_PROVIDER_SITE_OTHER)
Admission: RE | Admit: 2019-02-24 | Discharge: 2019-02-24 | Disposition: A | Discharge status: Home / Self Care | Payer: Medicaid Other | Source: Ambulatory Visit | Attending: Vascular Surgery | Admitting: Vascular Surgery

## 2019-02-24 ENCOUNTER — Ambulatory Visit (INDEPENDENT_AMBULATORY_CARE_PROVIDER_SITE_OTHER): Payer: Self-pay | Admitting: Vascular Surgery

## 2019-02-24 ENCOUNTER — Encounter: Payer: Self-pay | Admitting: Vascular Surgery

## 2019-02-24 ENCOUNTER — Ambulatory Visit (HOSPITAL_COMMUNITY)
Admission: RE | Admit: 2019-02-24 | Discharge: 2019-02-24 | Disposition: A | Discharge status: Home / Self Care | Payer: Medicaid Other | Source: Ambulatory Visit | Attending: Vascular Surgery | Admitting: Vascular Surgery

## 2019-02-24 DIAGNOSIS — N179 Acute kidney failure, unspecified: Secondary | ICD-10-CM | POA: Diagnosis not present

## 2019-02-24 DIAGNOSIS — Z992 Dependence on renal dialysis: Secondary | ICD-10-CM

## 2019-02-24 DIAGNOSIS — N186 End stage renal disease: Secondary | ICD-10-CM

## 2019-02-24 NOTE — Progress Notes (Signed)
Patient name: Paul Merritt MRN: [id number] DOB: [birth year] Sex: male  REASON FOR CONSULT: Evaluate for permanent AV fistula access  HPI: Paul Merritt is a 54 y.o. male, with history of diabetes and hypertension as well as stage V CKD that presents for evaluation of permanent dialysis access.  Patient was recently admitted with AKI on CKD and now has progressed to end-stage renal disease and has a right IJ tunneled catheter placed by IR.  He states he is currently dialyzing on Tuesday Thursday Saturday.  He has a right IJ tunnel catheter that is currently being used with no issues.  Patient is right-handed.  He denies any previous history of AV fistula access.  No chest wall implants except RIJ catheter.  No numbness or tingling in his upper extremities.  States he is currently on disability not working.  His renal disease is likely related to his underlying diabetes.  Past Medical History:  Diagnosis Date  . Anemia   . Diabetes mellitus without complication ([place])   . GERD (gastroesophageal reflux disease)   . Hypertension   . Noncompliance     Past Surgical History:  Procedure Laterality Date  . BACK SURGERY     [place] rod  . BIOPSY  10/03/2017   Procedure: BIOPSY;  Surgeon: Daneil Dolin, MD;  [facility];  Service: Endoscopy;;  bx of gastric ulcers  . ESOPHAGOGASTRODUODENOSCOPY (EGD) WITH PROPOFOL N/A 10/03/2017   Procedure: ESOPHAGOGASTRODUODENOSCOPY (EGD) WITH PROPOFOL;  Surgeon: Daneil Dolin, MD;  [facility];  Service: Endoscopy;  Laterality: N/A;  . EYE SURGERY     retinal detatchment  . IR FLUORO GUIDE CV LINE RIGHT  02/02/2019  . IR US GUIDE VASC ACCESS RIGHT  02/02/2019    History reviewed. No pertinent family history.  SOCIAL HISTORY: Social History   Socioeconomic History  . Marital status: Divorced    Spouse name: Not on file  . Number of children: Not on file  . Years of education: Not on file  . Highest education level: Not on file   Occupational History  . Not on file  Tobacco Use  . Smoking status: Never Smoker  . Smokeless tobacco: Never Used  Substance and Sexual Activity  . Alcohol use: Not Currently    Comment: Quit drinking alcohol  . Drug use: Never  . Sexual activity: Not on file  Other Topics Concern  . Not on file  Social History Narrative  . Not on file   Social Determinants of Health   Financial Resource Strain:   . Difficulty of Paying Living Expenses: Not on file  Food Insecurity:   . Worried About Charity fundraiser in the Last Year: Not on file  . Ran Out of Food in the Last Year: Not on file  Transportation Needs:   . Lack of Transportation (Medical): Not on file  . Lack of Transportation (Non-Medical): Not on file  Physical Activity:   . Days of Exercise per Week: Not on file  . Minutes of Exercise per Session: Not on file  Stress:   . Feeling of Stress : Not on file  Social Connections:   . Frequency of Communication with Friends and Family: Not on file  . Frequency of Social Gatherings with Friends and Family: Not on file  . Attends Religious Services: Not on file  . Active Member of Clubs or Organizations: Not on file  . Attends Archivist Meetings: Not on file  . Marital  Status: Not on file  Intimate Partner Violence:   . Fear of Current or Ex-Partner: Not on file  . Emotionally Abused: Not on file  . Physically Abused: Not on file  . Sexually Abused: Not on file    No Known Allergies  Current Outpatient Medications  Medication Sig Dispense Refill  . aspirin EC 81 MG tablet Take 81 mg by mouth daily.    . calcium acetate (PHOSLO) 667 MG capsule Take 667 mg by mouth 3 (three) times daily with meals.     . carvedilol (COREG) 12.5 MG tablet Take 12.5 mg by mouth 2 (two) times daily with a meal.    . Cholecalciferol (VITAMIN D-3) 125 MCG (5000 UT) TABS Take 5,000 Units by mouth daily.    Marland Kitchen docusate sodium (COLACE) 100 MG capsule Take 100 mg by mouth daily.    .  furosemide (LASIX) 40 MG tablet Take 40 mg by mouth 2 (two) times daily.    . hydrALAZINE (APRESOLINE) 50 MG tablet Take 50 mg by mouth 3 (three) times daily.    . insulin lispro (HUMALOG) 100 UNIT/ML injection Inject 5 Units into the skin 3 (three) times daily before meals.     No current facility-administered medications for this visit.    REVIEW OF SYSTEMS:  [X]  denotes positive finding, [ ]  denotes negative finding Cardiac  Comments:  Chest pain or chest pressure:    Shortness of breath upon exertion:    Short of breath when lying flat:    Irregular heart rhythm:        Vascular    Pain in calf, thigh, or hip brought on by ambulation:    Pain in feet at night that wakes you up from your sleep:     Blood clot in your veins:    Leg swelling:         Pulmonary    Oxygen at home:    Productive cough:     Wheezing:         Neurologic    Sudden weakness in arms or legs:     Sudden numbness in arms or legs:     Sudden onset of difficulty speaking or slurred speech:    Temporary loss of vision in one eye:     Problems with dizziness:         Gastrointestinal    Blood in stool:     Vomited blood:         Genitourinary    Burning when urinating:     Blood in urine:        Psychiatric    Major depression:         Hematologic    Bleeding problems:    Problems with blood clotting too easily:    Anemia x   Skin    Rashes or ulcers:        Constitutional    Fever or chills:      PHYSICAL EXAM: Vitals:   02/24/19 0836  BP: (!) 163/88  Pulse: 66  Resp: 16  Temp: 97.7 F (36.5 C)  TempSrc: Temporal  SpO2: 99%  Weight: 196 lb (88.9 kg)  Height: 5\' 9"  (1.753 m)    GENERAL: The patient is a well-nourished male, in no acute distress. The vital signs are documented above. CARDIAC: There is a regular rate and rhythm.  VASCULAR:  Palpable radial and brachial pulses bilateral upper extremities No tissue loss bilateral upper extremities PULMONARY: There is good air  exchange  bilaterally without wheezing or rales. ABDOMEN: Soft and non-tender with normal pitched bowel sounds.  MUSCULOSKELETAL: There are no major deformities or cyanosis. NEUROLOGIC: No focal weakness or paresthesias are detected. SKIN: There are no ulcers or rashes noted. PSYCHIATRIC: The patient has a normal affect.  DATA:   I dependently reviewed his noninvasive imaging and triphasic waveforms in the bilateral upper extremities.  Vein mapping shows usable cephalic and basilic vein in the left arm (non-dominant arm) that is very nice diameter.  Assessment/Plan:  54 year old male with diabetes and hypertension the presents for permanent dialysis access evaluation.  Recently hospitalized for AKI on CKD and has now progressed to requiring dialysis T, Th, Sat.  Discussed plan for placement of fistula in nondominant arm which will be his left arm.  He has a very nice cephalic vein and certainly has the option of a brachiocephalic fistula.  I will also evaluate the cephalic vein in his forearm which is marginal to see if a radiocephalic would also be an option.  We will plan on nondialysis day and look to schedule this on Monday.  Risk benefits were discussed including risk of bleeding, infection, steal syndrome, failure to mature, etc.   Marty Heck, MD [facility] Office: [phone]

## 2019-02-26 ENCOUNTER — Encounter (HOSPITAL_COMMUNITY): Payer: Self-pay | Admitting: Vascular Surgery

## 2019-02-26 ENCOUNTER — Other Ambulatory Visit (HOSPITAL_COMMUNITY): Admission: RE | Admit: 2019-02-26 | Payer: Self-pay | Source: Ambulatory Visit

## 2019-02-26 ENCOUNTER — Other Ambulatory Visit: Payer: Self-pay

## 2019-02-26 NOTE — Progress Notes (Signed)
Anesthesia Chart Review: Kathleene Hazel   Case: [id number] Date/Time: 03/02/19 0715   Procedure: ARTERIOVENOUS (AV) FISTULA CREATION (Left )   Anesthesia type: Monitor Anesthesia Care   Pre-op diagnosis: END STAGE RENAL DISEASE   Location: [facility] OR ROOM 16 / [place] OR   Surgeons: Marty Heck, MD      DISCUSSION: Patient is a 54 year old male scheduled for the above procedure. S/p right IJ tunneled dialysis catheter 02/02/19 and is undergoing HD TTS. He now needs permanent HD access.   History includes never smoker, DM2, HTN, GERD, anemia, ESRD (TTS), noncompliance, back surgery.  Per PAT RN phone interview, he denied chest pain and SOB. Reported home fasting CBGs ~ 110.  Last EKG seen is from 2019, so unless more recent then plan would be for ISTAT, EKG and anesthesia team evaluation on the day of surgery.  Preoperative COVID-19 test is scheduled for 02/27/2019.   VS: As of 02/24/19 BP 163/88, HR 66, WT 84.8 kg   PROVIDERS: Hasanaj, Samul Dada, MD is PCP  Ulice Bold, MD is nephrologist [facility] Everywhere)   LABS:  For ISTAT day of surgery. As of 02/02/19, H/H 7.6/23.5, Cr 7.79, K 6.1, glucose 140.    IMAGES: CXR 01/24/18: FINDINGS: None cardiac shadows within normal limits. Tortuosity of the thoracic aorta is noted. Stable scoliosis with fixation rod is noted. Lungs are well aerated without focal infiltrate or sizable effusion. IMPRESSION: No acute abnormality noted.   EKG: He is for updated EKG on arrival unless tracing received with in past year. 03/04/17 tracing showed NSR.    CV: Echo 12/23/18 [facility]): Summary  1. The left ventricle is normal in size with moderately increased wall  thickness.  2. Normal left ventricular systolic function, ejection fraction 50-55%.  3. Dilated right ventricle - mildly dilated.  4. Dilated left atrium - moderately dilated.  5. Mitral regurgitation - mild.  6. Pericardial effusion - small.  7. No  evidence to suggest cardiac tamponade.     Past Medical History:  Diagnosis Date  . Anemia   . Chronic kidney disease   . Diabetes mellitus without complication ([place])   . GERD (gastroesophageal reflux disease)   . Hypertension   . Noncompliance     Past Surgical History:  Procedure Laterality Date  . BACK SURGERY     [place] rod  . BIOPSY  10/03/2017   Procedure: BIOPSY;  Surgeon: Daneil Dolin, MD;  [facility];  Service: Endoscopy;;  bx of gastric ulcers  . ESOPHAGOGASTRODUODENOSCOPY (EGD) WITH PROPOFOL N/A 10/03/2017   Procedure: ESOPHAGOGASTRODUODENOSCOPY (EGD) WITH PROPOFOL;  Surgeon: Daneil Dolin, MD;  [facility];  Service: Endoscopy;  Laterality: N/A;  . EYE SURGERY Right 2020   retinal detatchment  . IR FLUORO GUIDE CV LINE RIGHT  02/02/2019  . IR US GUIDE VASC ACCESS RIGHT  02/02/2019    MEDICATIONS: No current facility-administered medications for this encounter.   Marland Kitchen aspirin EC 81 MG tablet  . calcium acetate (PHOSLO) 667 MG capsule  . carvedilol (COREG) 12.5 MG tablet  . Cholecalciferol (VITAMIN D-3) 125 MCG (5000 UT) TABS  . docusate sodium (COLACE) 100 MG capsule  . furosemide (LASIX) 40 MG tablet  . hydrALAZINE (APRESOLINE) 50 MG tablet  . insulin lispro (HUMALOG) 100 UNIT/ML injection     Myra Gianotti, PA-C Surgical Short Stay/Anesthesiology [facility] Phone [phone] [facility] Phone [phone] 02/26/2019 2:12 PM

## 2019-02-26 NOTE — Anesthesia Preprocedure Evaluation (Addendum)
Anesthesia Evaluation  Patient identified by MRN, date of birth, ID band Patient awake    Reviewed: Allergy & Precautions, NPO status , Patient's Chart, lab work & pertinent test results  Airway Mallampati: III  TM Distance: >3 FB Neck ROM: Full    Dental  (+) Missing, Partial Lower,    Pulmonary neg pulmonary ROS,    Pulmonary exam normal        Cardiovascular hypertension, Pt. on home beta blockers and Pt. on medications  Rhythm:Regular Rate:Normal     Neuro/Psych negative neurological ROS  negative psych ROS   GI/Hepatic Neg liver ROS, PUD, GERD  Medicated,  Endo/Other  diabetes, Type 2, Insulin Dependent  Renal/GU Renal Insufficiency, ESRF and DialysisRenal disease     Musculoskeletal negative musculoskeletal ROS (+)   Abdominal Normal abdominal exam  (+)   Peds  Hematology negative hematology ROS (+)   Anesthesia Other Findings   Reproductive/Obstetrics                            Lab Results  Component Value Date   WBC 4.6 02/02/2019   HGB 8.5 (L) 03/02/2019   HCT 25.0 (L) 03/02/2019   MCV 100.9 (H) 02/02/2019   PLT 235 02/02/2019   Covid-19 Nucleic Acid Test Results Lab Results  Component Value Date   SARSCOV2NAA NEGATIVE 02/27/2019   [place] Not Detected 11/12/2018   EKG: normal sinus rhythm.   Anesthesia Physical Anesthesia Plan  ASA: III  Anesthesia Plan: MAC   Post-op Pain Management:    Induction: Intravenous  PONV Risk Score and Plan: 2 and Ondansetron, Propofol infusion and Midazolam  Airway Management Planned: Simple Face Mask and Natural Airway  Additional Equipment: None  Intra-op Plan:   Post-operative Plan:   Informed Consent: I have reviewed the patients History and Physical, chart, labs and discussed the procedure including the risks, benefits and alternatives for the proposed anesthesia with the patient or authorized representative who  has indicated his/her understanding and acceptance.       Plan Discussed with: CRNA  Anesthesia Plan Comments: (PAT note written by Myra Gianotti, PA-C. )      Anesthesia Quick Evaluation

## 2019-02-26 NOTE — Progress Notes (Addendum)
Same Day Work-Up Phone Call:  PCP - Xaje A. Sherrie Sport, MD Cardiologist - Denies Nephrologist- Ulice Bold, MD  PPM/ICD - Denies  Chest x-ray - 01/24/18 EKG - DOS Stress Test - Denies ECHO - 12/23/18 (Care Everywhere) Cardiac Cath - Denies  Sleep Study - Denies  Fasting Blood Sugar - 110 Checks Blood Sugar every other day.   COVID TEST- 02/27/19 @ 11AM @ [facility]. Patient also instructed to self quarantine after being tested for COVID-19.   Anesthesia review: Yes, Recent Echo 12/23/18.   Patient instructed to report to [facility] Main Entrance "A" at 05:30 A.M on Monday Feb 22nd., and check in at the Admitting office. Patient instructed to call [phone] if any problems arise the morning of surgery.  Patient instructed not eat or drink after midnight the night before surgery.   Medication Instructions: DOS, take aspirin, carvedilol (COREG), hydrALAZINE (APRESOLINE); Do not take bedtime dose of Humalog the night before surgery, or DOS.  Patient instructed to check blood sugar DOS; Only If CBG on DOS > 220, take 2.5 units Humolog.  Patient instructed as of today STOP taking any Aleve, Naproxen, Ibuprofen, Motrin, Advil, Goody's, BC's, all herbal medications, fish oil, and all vitamins.  Patient instructed to have someone to transport him home after surgery, and remain with him for 24 hours if he is discharged the same day.   Further instructions: Please shower the morning of surgery. Do not apply any deodorants/lotions. Please wear clean clothes to the hospital/surgery center.   Remember to brush teeth WITH REGULAR TOOTHPASTE.  Do not wear jewelry  Do not bring valuables to the hospital.   Patient denies shortness of breath, fever, cough and chest pain during phone call.   All instructions explained to the patient, with a verbal understanding of the material.  The opportunity to ask questions was provided.

## 2019-02-27 ENCOUNTER — Other Ambulatory Visit (HOSPITAL_COMMUNITY)
Admission: RE | Admit: 2019-02-27 | Discharge: 2019-02-27 | Disposition: A | Discharge status: Home / Self Care | Payer: HRSA Program | Source: Ambulatory Visit | Attending: Internal Medicine | Admitting: Internal Medicine

## 2019-02-27 DIAGNOSIS — Z01812 Encounter for preprocedural laboratory examination: Secondary | ICD-10-CM | POA: Insufficient documentation

## 2019-02-27 DIAGNOSIS — Z20822 Contact with and (suspected) exposure to covid-19: Secondary | ICD-10-CM | POA: Insufficient documentation

## 2019-02-27 LAB — SARS CORONAVIRUS 2 (TAT 6-24 HRS): SARS Coronavirus 2: NEGATIVE

## 2019-03-02 ENCOUNTER — Other Ambulatory Visit: Payer: Self-pay

## 2019-03-02 ENCOUNTER — Encounter (HOSPITAL_COMMUNITY)
Admission: RE | Disposition: A | Discharge status: Home / Self Care | Payer: Self-pay | Source: Home / Self Care | Attending: Vascular Surgery

## 2019-03-02 ENCOUNTER — Ambulatory Visit (HOSPITAL_COMMUNITY): Payer: Medicaid Other | Admitting: Vascular Surgery

## 2019-03-02 ENCOUNTER — Encounter (HOSPITAL_COMMUNITY): Payer: Self-pay | Admitting: Vascular Surgery

## 2019-03-02 ENCOUNTER — Ambulatory Visit (HOSPITAL_COMMUNITY)
Admission: RE | Admit: 2019-03-02 | Discharge: 2019-03-02 | Disposition: A | Discharge status: Home / Self Care | Payer: Medicaid Other | Attending: Vascular Surgery | Admitting: Vascular Surgery

## 2019-03-02 DIAGNOSIS — Z7982 Long term (current) use of aspirin: Secondary | ICD-10-CM | POA: Insufficient documentation

## 2019-03-02 DIAGNOSIS — N186 End stage renal disease: Secondary | ICD-10-CM | POA: Diagnosis present

## 2019-03-02 DIAGNOSIS — Z794 Long term (current) use of insulin: Secondary | ICD-10-CM | POA: Insufficient documentation

## 2019-03-02 DIAGNOSIS — Z992 Dependence on renal dialysis: Secondary | ICD-10-CM | POA: Insufficient documentation

## 2019-03-02 DIAGNOSIS — I120 Hypertensive chronic kidney disease with stage 5 chronic kidney disease or end stage renal disease: Secondary | ICD-10-CM | POA: Diagnosis not present

## 2019-03-02 DIAGNOSIS — N179 Acute kidney failure, unspecified: Secondary | ICD-10-CM | POA: Insufficient documentation

## 2019-03-02 DIAGNOSIS — E1122 Type 2 diabetes mellitus with diabetic chronic kidney disease: Secondary | ICD-10-CM | POA: Diagnosis not present

## 2019-03-02 DIAGNOSIS — N185 Chronic kidney disease, stage 5: Secondary | ICD-10-CM

## 2019-03-02 DIAGNOSIS — Z79899 Other long term (current) drug therapy: Secondary | ICD-10-CM | POA: Insufficient documentation

## 2019-03-02 HISTORY — PX: AV FISTULA PLACEMENT: SHX1204

## 2019-03-02 HISTORY — DX: Chronic kidney disease, unspecified: N18.9

## 2019-03-02 LAB — POCT I-STAT, CHEM 8
BUN: 23 mg/dL — ABNORMAL HIGH (ref 6–20)
Calcium, Ion: 1.05 mmol/L — ABNORMAL LOW (ref 1.15–1.40)
Chloride: 99 mmol/L (ref 98–111)
Creatinine, Ser: 5.4 mg/dL — ABNORMAL HIGH (ref 0.61–1.24)
Glucose, Bld: 166 mg/dL — ABNORMAL HIGH (ref 70–99)
HCT: 25 % — ABNORMAL LOW (ref 39.0–52.0)
Hemoglobin: 8.5 g/dL — ABNORMAL LOW (ref 13.0–17.0)
Potassium: 3.5 mmol/L (ref 3.5–5.1)
Sodium: 138 mmol/L (ref 135–145)
TCO2: 29 mmol/L (ref 22–32)

## 2019-03-02 LAB — GLUCOSE, CAPILLARY
Glucose-Capillary: 160 mg/dL — ABNORMAL HIGH (ref 70–99)
Glucose-Capillary: 156 mg/dL — ABNORMAL HIGH (ref 70–99)

## 2019-03-02 SURGERY — ARTERIOVENOUS (AV) FISTULA CREATION
Anesthesia: Monitor Anesthesia Care | Laterality: Left

## 2019-03-02 MED ORDER — OXYCODONE HCL 5 MG PO TABS
ORAL_TABLET | ORAL | Status: AC
  Administered 2019-03-02: 5 mg via ORAL
  Filled 2019-03-02: qty 1

## 2019-03-02 MED ORDER — CHLORHEXIDINE GLUCONATE 4 % EX LIQD: 60.0000 mL | Freq: Once | CUTANEOUS | Status: DC

## 2019-03-02 MED ORDER — FENTANYL CITRATE (PF) 250 MCG/5ML IJ SOLN
INTRAMUSCULAR | Status: AC
  Filled 2019-03-02: qty 5

## 2019-03-02 MED ORDER — CEFAZOLIN SODIUM-DEXTROSE 2-4 GM/100ML-% IV SOLN
2.0000 g | INTRAVENOUS | Status: AC
  Administered 2019-03-02: 08:00:00 2 g via INTRAVENOUS

## 2019-03-02 MED ORDER — PROPOFOL 500 MG/50ML IV EMUL
INTRAVENOUS | Status: DC | PRN
  Administered 2019-03-02: 75 ug/kg/min via INTRAVENOUS

## 2019-03-02 MED ORDER — MIDAZOLAM HCL 5 MG/5ML IJ SOLN
INTRAMUSCULAR | Status: DC | PRN
  Administered 2019-03-02: 2 mg via INTRAVENOUS

## 2019-03-02 MED ORDER — MEPERIDINE HCL 25 MG/ML IJ SOLN: 6.2500 mg | INTRAMUSCULAR | Status: DC | PRN

## 2019-03-02 MED ORDER — 0.9 % SODIUM CHLORIDE (POUR BTL) OPTIME
TOPICAL | Status: DC | PRN
  Administered 2019-03-02: 1000 mL

## 2019-03-02 MED ORDER — HYDROCODONE-ACETAMINOPHEN 5-325 MG PO TABS: 1.0000 | ORAL_TABLET | ORAL | 0 refills | Status: AC | PRN

## 2019-03-02 MED ORDER — PROMETHAZINE HCL 25 MG/ML IJ SOLN: 6.2500 mg | INTRAMUSCULAR | Status: DC | PRN

## 2019-03-02 MED ORDER — ONDANSETRON HCL 4 MG/2ML IJ SOLN
INTRAMUSCULAR | Status: DC | PRN
  Administered 2019-03-02: 4 mg via INTRAVENOUS

## 2019-03-02 MED ORDER — MIDAZOLAM HCL 2 MG/2ML IJ SOLN
INTRAMUSCULAR | Status: AC
  Filled 2019-03-02: qty 2

## 2019-03-02 MED ORDER — ACETAMINOPHEN 160 MG/5ML PO SOLN: 325.0000 mg | Freq: Once | ORAL | Status: DC | PRN

## 2019-03-02 MED ORDER — LACTATED RINGERS IV SOLN: INTRAVENOUS | Status: DC

## 2019-03-02 MED ORDER — SODIUM CHLORIDE 0.9 % IV SOLN
INTRAVENOUS | Status: AC
  Filled 2019-03-02: qty 1.2

## 2019-03-02 MED ORDER — HEPARIN SODIUM (PORCINE) 1000 UNIT/ML IJ SOLN
INTRAMUSCULAR | Status: DC | PRN
  Administered 2019-03-02: 3000 [IU] via INTRAVENOUS

## 2019-03-02 MED ORDER — ONDANSETRON HCL 4 MG/2ML IJ SOLN
INTRAMUSCULAR | Status: AC
  Filled 2019-03-02: qty 2

## 2019-03-02 MED ORDER — ACETAMINOPHEN 10 MG/ML IV SOLN: 1000.0000 mg | Freq: Once | INTRAVENOUS | Status: DC | PRN

## 2019-03-02 MED ORDER — PROPOFOL 10 MG/ML IV BOLUS
INTRAVENOUS | Status: AC
  Filled 2019-03-02: qty 20

## 2019-03-02 MED ORDER — SODIUM CHLORIDE 0.9 % IV SOLN
INTRAVENOUS | Status: DC | PRN
  Administered 2019-03-02: 500 mL

## 2019-03-02 MED ORDER — ACETAMINOPHEN 325 MG PO TABS: 325.0000 mg | ORAL_TABLET | Freq: Once | ORAL | Status: DC | PRN

## 2019-03-02 MED ORDER — LIDOCAINE HCL 1 % IJ SOLN
INTRAMUSCULAR | Status: DC | PRN
  Administered 2019-03-02: 7 mL

## 2019-03-02 MED ORDER — SODIUM CHLORIDE 0.9 % IV SOLN: INTRAVENOUS | Status: DC

## 2019-03-02 MED ORDER — OXYCODONE HCL 5 MG PO TABS: 5.0000 mg | ORAL_TABLET | Freq: Once | ORAL | Status: AC

## 2019-03-02 MED ORDER — FENTANYL CITRATE (PF) 100 MCG/2ML IJ SOLN: 25.0000 ug | INTRAMUSCULAR | Status: DC | PRN

## 2019-03-02 MED ORDER — SODIUM CHLORIDE 0.9 % IV SOLN: INTRAVENOUS | Status: DC | PRN

## 2019-03-02 MED ORDER — LIDOCAINE HCL (PF) 1 % IJ SOLN
INTRAMUSCULAR | Status: AC
  Filled 2019-03-02: qty 30

## 2019-03-02 MED ORDER — FENTANYL CITRATE (PF) 100 MCG/2ML IJ SOLN
INTRAMUSCULAR | Status: DC | PRN
  Administered 2019-03-02: 50 ug via INTRAVENOUS

## 2019-03-02 SURGICAL SUPPLY — 36 items
ARMBAND PINK RESTRICT EXTREMIT (MISCELLANEOUS) ×3 IMPLANT
CANISTER SUCT 3000ML PPV (MISCELLANEOUS) ×3 IMPLANT
CLIP VESOCCLUDE MED 6/CT (CLIP) ×3 IMPLANT
CLIP VESOCCLUDE SM WIDE 6/CT (CLIP) ×3 IMPLANT
COVER PROBE W GEL 5X96 (DRAPES) ×3 IMPLANT
COVER WAND RF STERILE (DRAPES) IMPLANT
DECANTER SPIKE VIAL GLASS SM (MISCELLANEOUS) IMPLANT
DERMABOND ADVANCED (GAUZE/BANDAGES/DRESSINGS) ×2
DERMABOND ADVANCED .7 DNX12 (GAUZE/BANDAGES/DRESSINGS) ×1 IMPLANT
ELECT REM PT RETURN 9FT ADLT (ELECTROSURGICAL) ×3
ELECTRODE REM PT RTRN 9FT ADLT (ELECTROSURGICAL) ×1 IMPLANT
GLOVE BIO SURGEON STRL SZ7.5 (GLOVE) ×3 IMPLANT
GLOVE BIO SURGEON STRL SZ8 (GLOVE) ×3 IMPLANT
GLOVE BIOGEL PI IND STRL 6.5 (GLOVE) ×2 IMPLANT
GLOVE BIOGEL PI IND STRL 8 (GLOVE) ×1 IMPLANT
GLOVE BIOGEL PI INDICATOR 6.5 (GLOVE) ×4
GLOVE BIOGEL PI INDICATOR 8 (GLOVE) ×2
GLOVE SURG SS PI 6.5 STRL IVOR (GLOVE) ×3 IMPLANT
GOWN STRL REUS W/ TWL LRG LVL3 (GOWN DISPOSABLE) ×1 IMPLANT
GOWN STRL REUS W/ TWL XL LVL3 (GOWN DISPOSABLE) ×1 IMPLANT
GOWN STRL REUS W/TWL LRG LVL3 (GOWN DISPOSABLE) ×2
GOWN STRL REUS W/TWL XL LVL3 (GOWN DISPOSABLE) ×2
HEMOSTAT SPONGE AVITENE ULTRA (HEMOSTASIS) IMPLANT
KIT BASIN OR (CUSTOM PROCEDURE TRAY) ×3 IMPLANT
KIT TURNOVER KIT B (KITS) ×3 IMPLANT
NS IRRIG 1000ML POUR BTL (IV SOLUTION) ×3 IMPLANT
PACK CV ACCESS (CUSTOM PROCEDURE TRAY) ×3 IMPLANT
PAD ARMBOARD 7.5X6 YLW CONV (MISCELLANEOUS) ×3 IMPLANT
SUT MNCRL AB 4-0 PS2 18 (SUTURE) ×3 IMPLANT
SUT PROLENE 6 0 BV (SUTURE) ×3 IMPLANT
SUT PROLENE 7 0 BV 1 (SUTURE) IMPLANT
SUT VIC AB 3-0 SH 27 (SUTURE) ×2
SUT VIC AB 3-0 SH 27X BRD (SUTURE) ×1 IMPLANT
TOWEL GREEN STERILE (TOWEL DISPOSABLE) ×3 IMPLANT
UNDERPAD 30X30 (UNDERPADS AND DIAPERS) ×3 IMPLANT
WATER STERILE IRR 1000ML POUR (IV SOLUTION) ×3 IMPLANT

## 2019-03-02 NOTE — Anesthesia Procedure Notes (Signed)
Procedure Name: MAC Date/Time: 03/02/2019 7:34 AM Performed by: Candis Shine, CRNA Pre-anesthesia Checklist: Patient identified, Emergency Drugs available, Suction available, Patient being monitored and Timeout performed Patient Re-evaluated:Patient Re-evaluated prior to induction Oxygen Delivery Method: Simple face mask Dental Injury: Teeth and Oropharynx as per pre-operative assessment

## 2019-03-02 NOTE — Discharge Instructions (Addendum)
[facility]  Discharge Instructions  AV Fistula or Graft Surgery for Dialysis Access  Please refer to the following instructions for your post-procedure care. Your surgeon or physician assistant will discuss any changes with you.  Activity  You may drive the day following your surgery, if you are comfortable and no longer taking prescription pain medication. Resume full activity as the soreness in your incision resolves.  Bathing/Showering  You may shower after you go home. Keep your incision dry for 48 hours. Do not soak in a bathtub, hot tub, or swim until the incision heals completely. You may not shower if you have a hemodialysis catheter.  Incision Care  Clean your incision with mild soap and water after 48 hours. Pat the area dry with a clean towel. You do not need a bandage unless otherwise instructed. Do not apply any ointments or creams to your incision. You may have skin glue on your incision. Do not peel it off. It will come off on its own in about one week. Your arm may swell a bit after surgery. To reduce swelling use pillows to elevate your arm so it is above your heart. Your doctor will tell you if you need to lightly wrap your arm with an ACE bandage.  Diet  Resume your normal diet. There are not special food restrictions following this procedure. In order to heal from your surgery, it is CRITICAL to get adequate nutrition. Your body requires vitamins, minerals, and protein. Vegetables are the best source of vitamins and minerals. Vegetables also provide the perfect balance of protein. Processed food has little nutritional value, so try to avoid this.  Medications  Resume taking all of your medications. If your incision is causing pain, you may take over-the counter pain relievers such as acetaminophen (Tylenol). If you were prescribed a stronger pain medication, please be aware these medications can cause nausea and constipation. Prevent  nausea by taking the medication with a snack or meal. Avoid constipation by drinking plenty of fluids and eating foods with high amount of fiber, such as fruits, vegetables, and grains.  Do not take Tylenol if you are taking prescription pain medications.  Follow up Your surgeon may want to see you in the office following your access surgery. If so, this will be arranged at the time of your surgery.  Please call us immediately for any of the following conditions:  . Increased pain, redness, drainage (pus) from your incision site . Fever of 101 degrees or higher . Severe or worsening pain at your incision site . Hand pain or numbness. .  Reduce your risk of vascular disease:  . Stop smoking. If you would like help, call [facility] at 1-800-QUIT-NOW 346-446-5539) or [place] at (573)228-6188  . Manage your cholesterol . Maintain a desired weight . Control your diabetes . Keep your blood pressure down  Dialysis  It will take several weeks to several months for your new dialysis access to be ready for use. Your surgeon will determine when it is okay to use it. Your nephrologist will continue to direct your dialysis. You can continue to use your Permcath until your new access is ready for use.   03/02/2019 Paul Merritt [id number] Feb 09, 1965  Surgeon(s): Marty Heck, MD  Procedure(s): ARTERIOVENOUS (AV) FISTULA CREATION LEFT ARM   May stick graft immediately   May stick graft on designated area only:   x Do not stick until follow-up    If you have  any questions, please call the office at [phone].

## 2019-03-02 NOTE — H&P (Signed)
History and Physical Interval Note:  03/02/2019 7:22 AM  Paul Merritt  has presented today for surgery, with the diagnosis of END STAGE RENAL DISEASE.  The various methods of treatment have been discussed with the patient and family. After consideration of risks, benefits and other options for treatment, the patient has consented to  Procedure(s): ARTERIOVENOUS (AV) FISTULA CREATION (Left) as a surgical intervention.  The patient's history has been reviewed, patient examined, no change in status, stable for surgery.  I have reviewed the patient's chart and labs.  Questions were answered to the patient's satisfaction.    Left arm AVF.  Paul Merritt  Patient name: Paul Merritt MRN: [id number] DOB: 1965/07/21 Sex: male  REASON FOR CONSULT: Evaluate for permanent AV fistula access  HPI:  Paul Merritt is a 54 y.o. male, with history of diabetes and hypertension as well as stage V CKD that presents for evaluation of permanent dialysis access. Patient was recently admitted with AKI on CKD and now has progressed to end-stage renal disease and has a right IJ tunneled catheter placed by IR. He states he is currently dialyzing on Tuesday Thursday Saturday. He has a right IJ tunnel catheter that is currently being used with no issues. Patient is right-handed. He denies any previous history of AV fistula access. No chest wall implants except RIJ catheter. No numbness or tingling in his upper extremities. States he is currently on disability not working. His renal disease is likely related to his underlying diabetes.      Past Medical History:  Diagnosis Date  . Anemia   . Diabetes mellitus without complication ([place])   . GERD (gastroesophageal reflux disease)   . Hypertension   . Noncompliance         Past Surgical History:  Procedure Laterality Date  . BACK SURGERY     [place] rod  . BIOPSY  10/03/2017   Procedure: BIOPSY; Surgeon: Daneil Dolin, MD; Location: [facility]; Service:  Endoscopy;; bx of gastric ulcers  . ESOPHAGOGASTRODUODENOSCOPY (EGD) WITH PROPOFOL N/A 10/03/2017   Procedure: ESOPHAGOGASTRODUODENOSCOPY (EGD) WITH PROPOFOL; Surgeon: Daneil Dolin, MD; Location: [facility]; Service: Endoscopy; Laterality: N/A;  . EYE SURGERY     retinal detatchment  . IR FLUORO GUIDE CV LINE RIGHT  02/02/2019  . IR US GUIDE VASC ACCESS RIGHT  02/02/2019   History reviewed. No pertinent family history.  SOCIAL HISTORY:  Social History        Socioeconomic History  . Marital status: Divorced    Spouse name: Not on file  . Number of children: Not on file  . Years of education: Not on file  . Highest education level: Not on file  Occupational History  . Not on file  Tobacco Use  . Smoking status: Never Smoker  . Smokeless tobacco: Never Used  Substance and Sexual Activity  . Alcohol use: Not Currently    Comment: Quit drinking alcohol  . Drug use: Never  . Sexual activity: Not on file  Other Topics Concern  . Not on file  Social History Narrative  . Not on file   Social Determinants of Health      Financial Resource Strain:   . Difficulty of Paying Living Expenses: Not on file  Food Insecurity:   . Worried About Charity fundraiser in the Last Year: Not on file  . Ran Out of Food in the Last Year: Not on file  Transportation Needs:   . Lack of Transportation (Medical):  Not on file  . Lack of Transportation (Non-Medical): Not on file  Physical Activity:   . Days of Exercise per Week: Not on file  . Minutes of Exercise per Session: Not on file  Stress:   . Feeling of Stress : Not on file  Social Connections:   . Frequency of Communication with Friends and Family: Not on file  . Frequency of Social Gatherings with Friends and Family: Not on file  . Attends Religious Services: Not on file  . Active Member of Clubs or Organizations: Not on file  . Attends Archivist Meetings: Not on file  . Marital Status: Not on file  Intimate Partner  Violence:   . Fear of Current or Ex-Partner: Not on file  . Emotionally Abused: Not on file  . Physically Abused: Not on file  . Sexually Abused: Not on file   No Known Allergies        Current Outpatient Medications  Medication Sig Dispense Refill  . aspirin EC 81 MG tablet Take 81 mg by mouth daily.    . calcium acetate (PHOSLO) 667 MG capsule Take 667 mg by mouth 3 (three) times daily with meals.     . carvedilol (COREG) 12.5 MG tablet Take 12.5 mg by mouth 2 (two) times daily with a meal.    . Cholecalciferol (VITAMIN D-3) 125 MCG (5000 UT) TABS Take 5,000 Units by mouth daily.    Marland Kitchen docusate sodium (COLACE) 100 MG capsule Take 100 mg by mouth daily.    . furosemide (LASIX) 40 MG tablet Take 40 mg by mouth 2 (two) times daily.    . hydrALAZINE (APRESOLINE) 50 MG tablet Take 50 mg by mouth 3 (three) times daily.    . insulin lispro (HUMALOG) 100 UNIT/ML injection Inject 5 Units into the skin 3 (three) times daily before meals.     No current facility-administered medications for this visit.   REVIEW OF SYSTEMS:  [X]  denotes positive finding, [ ]  denotes negative finding  Cardiac  Comments:  Chest pain or chest pressure:    Shortness of breath upon exertion:    Short of breath when lying flat:    Irregular heart rhythm:        Vascular    Pain in calf, thigh, or hip brought on by ambulation:    Pain in feet at night that wakes you up from your sleep:     Blood clot in your veins:    Leg swelling:         Pulmonary    Oxygen at home:    Productive cough:     Wheezing:         Neurologic    Sudden weakness in arms or legs:     Sudden numbness in arms or legs:     Sudden onset of difficulty speaking or slurred speech:    Temporary loss of vision in one eye:     Problems with dizziness:         Gastrointestinal    Blood in stool:     Vomited blood:         Genitourinary    Burning when urinating:     Blood in urine:        Psychiatric    Major depression:          Hematologic    Bleeding problems:    Problems with blood clotting too easily:    Anemia x   Skin  Rashes or ulcers:        Constitutional    Fever or chills:    PHYSICAL EXAM:     Vitals:   02/24/19 0836  BP: (!) 163/88  Pulse: 66  Resp: 16  Temp: 97.7 F (36.5 C)  TempSrc: Temporal  SpO2: 99%  Weight: 196 lb (88.9 kg)  Height: 5\' 9"  (1.753 m)   GENERAL: The patient is a well-nourished male, in no acute distress. The vital signs are documented above.  CARDIAC: There is a regular rate and rhythm.  VASCULAR:  Palpable radial and brachial pulses bilateral upper extremities  No tissue loss bilateral upper extremities  PULMONARY: There is good air exchange bilaterally without wheezing or rales.  ABDOMEN: Soft and non-tender with normal pitched bowel sounds.  MUSCULOSKELETAL: There are no major deformities or cyanosis.  NEUROLOGIC: No focal weakness or paresthesias are detected.  SKIN: There are no ulcers or rashes noted.  PSYCHIATRIC: The patient has a normal affect.  DATA:  I dependently reviewed his noninvasive imaging and triphasic waveforms in the bilateral upper extremities.  Vein mapping shows usable cephalic and basilic vein in the left arm (non-dominant arm) that is very nice diameter.  Assessment/Plan:  54 year old male with diabetes and hypertension the presents for permanent dialysis access evaluation. Recently hospitalized for AKI on CKD and has now progressed to requiring dialysis T, Th, Sat. Discussed plan for placement of fistula in nondominant arm which will be his left arm. He has a very nice cephalic vein and certainly has the option of a brachiocephalic fistula. I will also evaluate the cephalic vein in his forearm which is marginal to see if a radiocephalic would also be an option. We will plan on nondialysis day and look to schedule this on Monday. Risk benefits were discussed including risk of bleeding, infection, steal syndrome, failure to mature, etc.   Paul Heck, MD  [facility]  Office: [phone]

## 2019-03-02 NOTE — Transfer of Care (Signed)
Immediate Anesthesia Transfer of Care Note  Patient: Carita Pian  Procedure(s) Performed: ARTERIOVENOUS (AV) FISTULA CREATION LEFT ARM (Left )  Patient Location: PACU  Anesthesia Type:MAC  Level of Consciousness: awake, alert  and oriented  Airway & Oxygen Therapy: Patient Spontanous Breathing and Patient connected to face mask oxygen  Post-op Assessment: Report given to RN and Post -op Vital signs reviewed and stable  Post vital signs: Reviewed and stable  Last Vitals:  Vitals Value Taken Time  BP 148/81 03/02/19 0852  Temp    Pulse 80 03/02/19 0853  Resp 13 03/02/19 0853  SpO2 98 % 03/02/19 0853  Vitals shown include unvalidated device data.  Last Pain:  Vitals:   03/02/19 0557  TempSrc: Oral  PainSc: 0-No pain         Complications: No apparent anesthesia complications

## 2019-03-02 NOTE — Op Note (Signed)
OPERATIVE NOTE   PROCEDURE: left brachiocephalic arteriovenous fistula placement  PRE-OPERATIVE DIAGNOSIS: ESRD  POST-OPERATIVE DIAGNOSIS: same as above   SURGEON: Marty Heck, MD  ASSISTANT(S): OR Staff  ANESTHESIA: MAC  ESTIMATED BLOOD LOSS: Minimal  FINDING(S): 1.  Cephalic vein: 3 mm, acceptable 2.  Brachial artery: 5 mm, disease free 3.  Venous outflow: palpable thrill  4.  Radial flow: palpable radial pulse  SPECIMEN(S):  none  INDICATIONS:   Paul Merritt is a 54 y.o. male who presents with AKI on CKD and recent progression to ESRD.  The patient is scheduled for left arm access procedure.  After evaluation with ultrasound in the OR elected to perform brachiocephalic arteriovenous fistula placement.  The patient is aware the risks include but are not limited to: bleeding, infection, steal syndrome, nerve damage, ischemic monomelic neuropathy, failure to mature, and need for additional procedures.  The patient is aware of the risks of the procedure and elects to proceed forward.   DESCRIPTION: After full informed written consent was obtained from the patient, the patient was brought back to the operating room and placed supine upon the operating table.  Prior to induction, the patient received IV antibiotics.   After obtaining adequate anesthesia, the patient was then prepped and draped in the standard fashion for a left arm access procedure.  I turned my attention first to identifying the patient's cephalic vein.  It looked smaller in the mid to distal forearm and elected to place this just below the elbow to the brachial artery.  Using SonoSite guidance, the location of these vessels were marked out on the skin.     At this point, I injected local anesthetic to obtain a field block of the antecubitum.  In total, I injected about 7 mL of 1% lidocaine without epinephrine.  I made a transverse incision just below the level of the antecubitum and dissected through  the subcutaneous tissue and fascia to gain exposure of the brachial artery.  This was noted to be 5 mm in diameter externally.  This was dissected out proximally and distally and controlled with vessel loops .  I then dissected out the cephalic vein.  This was noted to be 3 mm in diameter externally.  The distal segment of the vein was ligated with a  2-0 silk, and the vein was transected.  The proximal segment was interrogated with serial dilators.  The vein accepted up to a 3.5 mm dilator without any difficulty.  I then instilled the heparinized saline into the vein and clamped it.  The patient was given 3000 units IV heparin.  At this point, I reset my exposure of the brachial artery and placed the artery under tension proximally and distally.  I made an arteriotomy with a #11 blade, and then I extended the arteriotomy with a Potts scissor.  I injected heparinized saline proximal and distal to this arteriotomy.  The vein was then sewn to the artery in an end-to-side configuration with a running stitch of 6-0 Prolene.  Prior to completing this anastomosis, I allowed the vein and artery to backbleed.  There was no evidence of clot from any vessels.  I completed the anastomosis in the usual fashion and then released all vessel loops and clamps.    There was a palpable thrill in the venous outflow, and there was a palpable radial pulse.  At this point, I irrigated out the surgical wound.  There was no further active bleeding.  The subcutaneous  tissue was reapproximated with a running stitch of 3-0 Vicryl.  The skin was then reapproximated with a running subcuticular stitch of 4-0 Monocryl.  The skin was then cleaned, dried, and reinforced with Dermabond.  The patient tolerated this procedure well.   COMPLICATIONS: None  CONDITION: Stable  Marty Heck, MD [facility]: [phone]  03/02/2019, 8:43 AM

## 2019-03-02 NOTE — Anesthesia Postprocedure Evaluation (Signed)
Anesthesia Post Note  Patient: Paul Merritt  Procedure(s) Performed: ARTERIOVENOUS (AV) FISTULA CREATION LEFT ARM (Left )     Patient location during evaluation: PACU Anesthesia Type: MAC Level of consciousness: awake and alert Pain management: pain level controlled Vital Signs Assessment: post-procedure vital signs reviewed and stable Respiratory status: spontaneous breathing, nonlabored ventilation, respiratory function stable and patient connected to nasal cannula oxygen Cardiovascular status: stable and blood pressure returned to baseline Postop Assessment: no apparent nausea or vomiting Anesthetic complications: no    Last Vitals:  Vitals:   03/02/19 0855 03/02/19 0925  BP: (!) 148/81 (!) 144/80  Pulse: 79 76  Resp: 13 12  Temp: 36.8 C 36.7 C  SpO2: 97% 94%    Last Pain:  Vitals:   03/02/19 0925  TempSrc:   PainSc: Asleep                 Effie Berkshire

## 2019-04-13 ENCOUNTER — Other Ambulatory Visit: Payer: Self-pay | Admitting: *Deleted

## 2019-04-13 DIAGNOSIS — N186 End stage renal disease: Secondary | ICD-10-CM

## 2019-04-14 ENCOUNTER — Ambulatory Visit (HOSPITAL_COMMUNITY)
Admission: RE | Admit: 2019-04-14 | Discharge: 2019-04-14 | Disposition: A | Discharge status: Home / Self Care | Payer: Medicare Other | Source: Ambulatory Visit | Attending: Vascular Surgery | Admitting: Vascular Surgery

## 2019-04-14 ENCOUNTER — Ambulatory Visit (INDEPENDENT_AMBULATORY_CARE_PROVIDER_SITE_OTHER): Payer: Self-pay | Admitting: Physician Assistant

## 2019-04-14 ENCOUNTER — Other Ambulatory Visit: Payer: Self-pay

## 2019-04-14 VITALS — BP 178/94 | HR 93 | Temp 97.3°F | Resp 16 | Ht 69.0 in | Wt 176.1 lb

## 2019-04-14 DIAGNOSIS — Z992 Dependence on renal dialysis: Secondary | ICD-10-CM

## 2019-04-14 DIAGNOSIS — N186 End stage renal disease: Secondary | ICD-10-CM | POA: Diagnosis present

## 2019-04-14 NOTE — Progress Notes (Signed)
  POST OPERATIVE OFFICE NOTE    CC:  F/u for surgery  HPI:  This is a 54 y.o. male who is s/p left brachiocephalic arteriovenous fistula on March 02, 2019 by Dr. Carlis Abbott.  Currently dialyzing via right IJ TDC. He denies hand pain, fever or chills. He dialyzes in [place] on Mondays Wednesdays and Fridays. No Known Allergies  Current Outpatient Medications  Medication Sig Dispense Refill  . aspirin EC 81 MG tablet Take 81 mg by mouth daily.    . calcium acetate (PHOSLO) 667 MG capsule Take 667 mg by mouth 3 (three) times daily with meals.     . carvedilol (COREG) 12.5 MG tablet Take 12.5 mg by mouth 2 (two) times daily with a meal.    . Cholecalciferol (VITAMIN D-3) 125 MCG (5000 UT) TABS Take 5,000 Units by mouth daily.    Marland Kitchen docusate sodium (COLACE) 100 MG capsule Take 100 mg by mouth daily.    . furosemide (LASIX) 40 MG tablet Take 40 mg by mouth 2 (two) times daily.    . hydrALAZINE (APRESOLINE) 50 MG tablet Take 50 mg by mouth 3 (three) times daily.    Marland Kitchen HYDROcodone-acetaminophen (NORCO/VICODIN) 5-325 MG tablet Take 1 tablet by mouth every 4 (four) hours as needed for moderate pain. 15 tablet 0  . insulin lispro (HUMALOG) 100 UNIT/ML injection Inject 5 Units into the skin 3 (three) times daily before meals.    . sodium bicarbonate 650 MG tablet Take 650 mg by mouth 3 (three) times daily.     No current facility-administered medications for this visit.     ROS:  See HPI  Physical Exam: Incision: Left antecubital fossa incision is well-healed Extremities: 5 out of 5 right hand grip strength.  2+ radial pulse.  Good thrill in fistula.  It is palpable along its course of the upper arm. Neuro: Alert and oriented x4  Duplex examination left AV fistula: Native artery inflow 1610 mL/min Fistula diameter range 0.76-0.89 cm Fistula depth 0.21 to 0.34 cm  Assessment/Plan:  This is a 54 y.o. male who is s/p: 6 weeks status post left brachiocephalic AV fistula.  His fistula  measures an appropriate diameter and depth for use.  May begin accessing the fistula on May 25, 2019.  -Once his fistula is being accessed, may arrange removal of [place] at nephrologists aggression. Follow-up as needed   Jannet Mantis, PA-C [facility] [phone]  Clinic MD:  Carlis Abbott

## 2019-06-01 ENCOUNTER — Other Ambulatory Visit (HOSPITAL_COMMUNITY): Payer: Self-pay | Admitting: Medical

## 2019-06-01 DIAGNOSIS — N186 End stage renal disease: Secondary | ICD-10-CM

## 2019-06-04 ENCOUNTER — Ambulatory Visit (HOSPITAL_COMMUNITY)
Admission: RE | Admit: 2019-06-04 | Discharge: 2019-06-04 | Disposition: A | Discharge status: Home / Self Care | Payer: Medicare Other | Source: Ambulatory Visit | Attending: Medical | Admitting: Medical

## 2019-06-04 DIAGNOSIS — Z4901 Encounter for fitting and adjustment of extracorporeal dialysis catheter: Secondary | ICD-10-CM | POA: Diagnosis present

## 2019-06-04 DIAGNOSIS — N186 End stage renal disease: Secondary | ICD-10-CM | POA: Insufficient documentation

## 2019-06-04 HISTORY — PX: IR REMOVAL TUN CV CATH W/O FL: IMG2289

## 2019-06-04 MED ORDER — LIDOCAINE HCL 1 % IJ SOLN
INTRAMUSCULAR | Status: DC | PRN
  Administered 2019-06-04: 5 mL

## 2019-06-04 MED ORDER — CHLORHEXIDINE GLUCONATE 4 % EX LIQD
CUTANEOUS | Status: DC | PRN
  Administered 2019-06-04: 1 via TOPICAL

## 2019-06-04 NOTE — Procedures (Signed)
Successful removal of tunneled (R)IJ HD cath. No complications.  Ascencion Dike PA-C Interventional Radiology 06/04/2019 2:07 PM

## 2020-03-05 IMAGING — US US RENAL
1 series · 14 of 25 positions shown · non-contrast
Comparison: CT 04/17/2017

CLINICAL DATA: Acute renal failure

EXAM:
RENAL / URINARY TRACT ULTRASOUND COMPLETE

[Series 1: us renal · 14 of 45 slices shown]
[im 1/45]
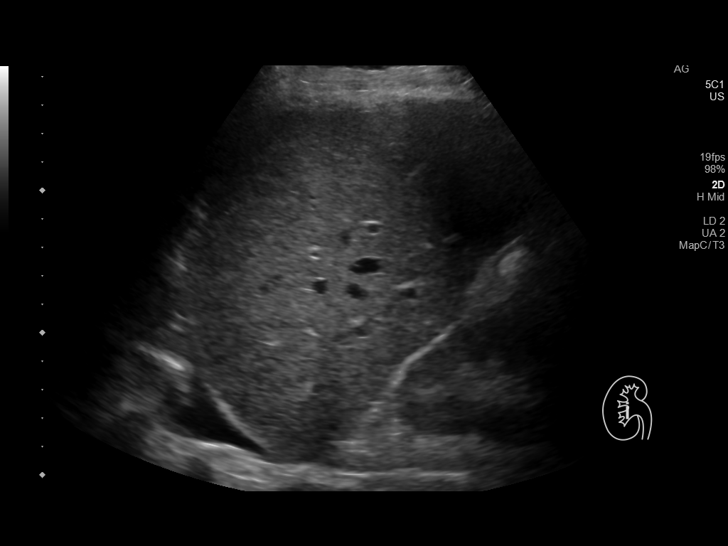
[im 4/45]
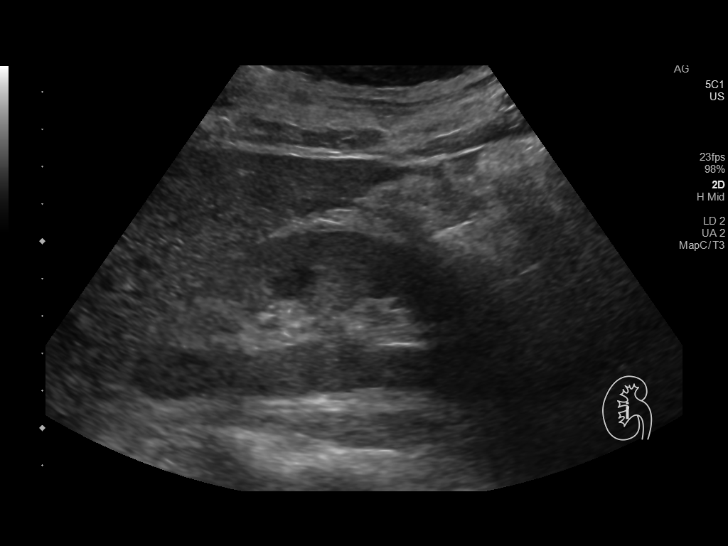
[im 8/45]
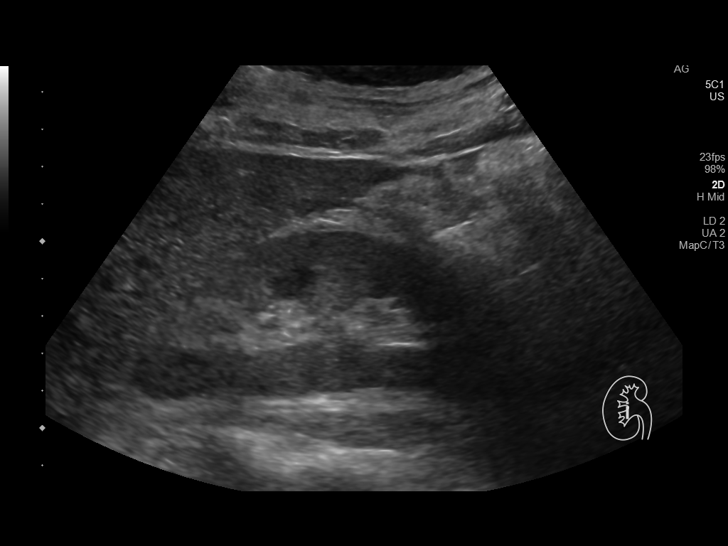
[im 12/45]
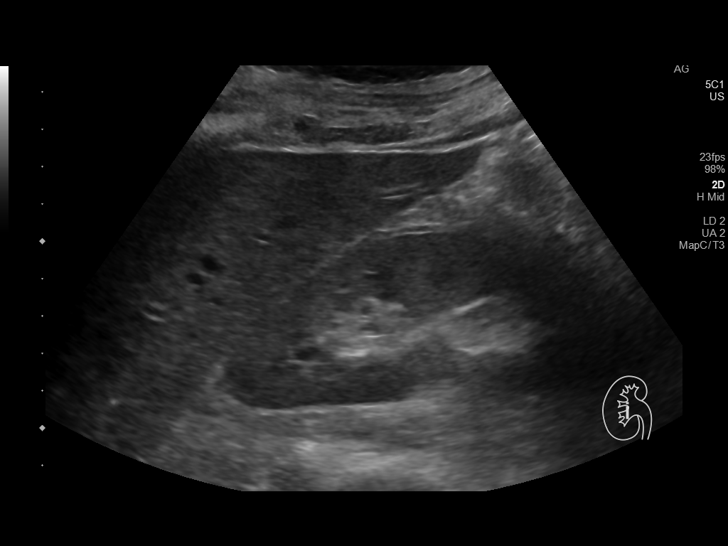
[im 15/45]
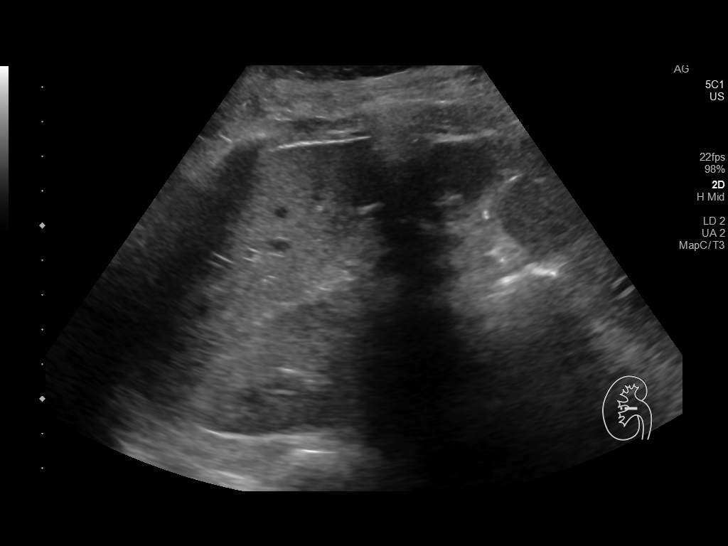
[im 17/45]
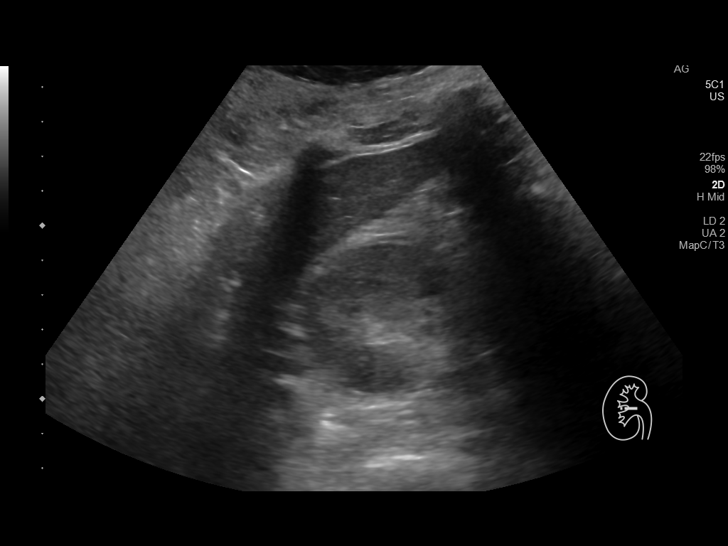
[im 21/45]
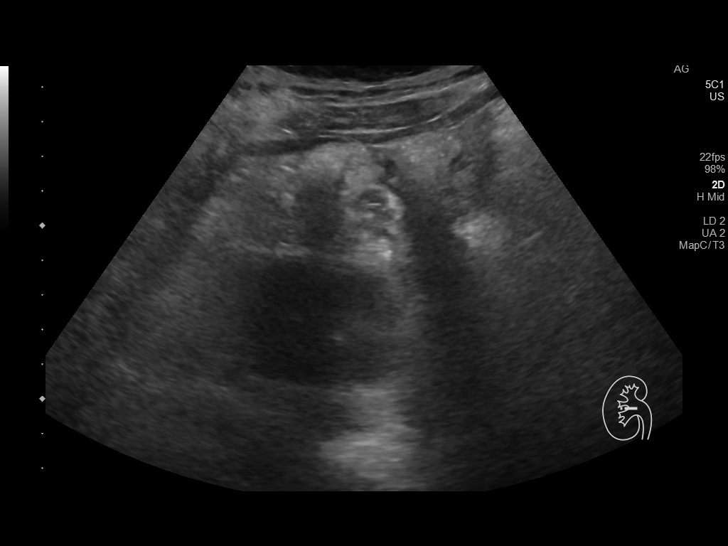
[im 24/45]
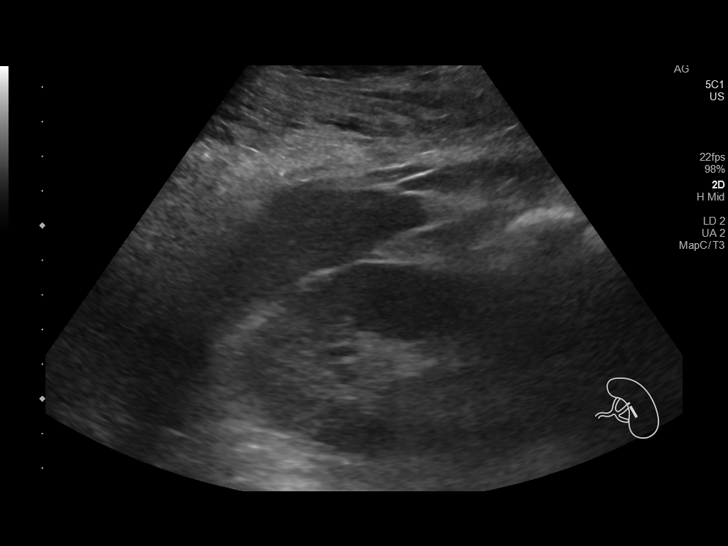
[im 28/45]
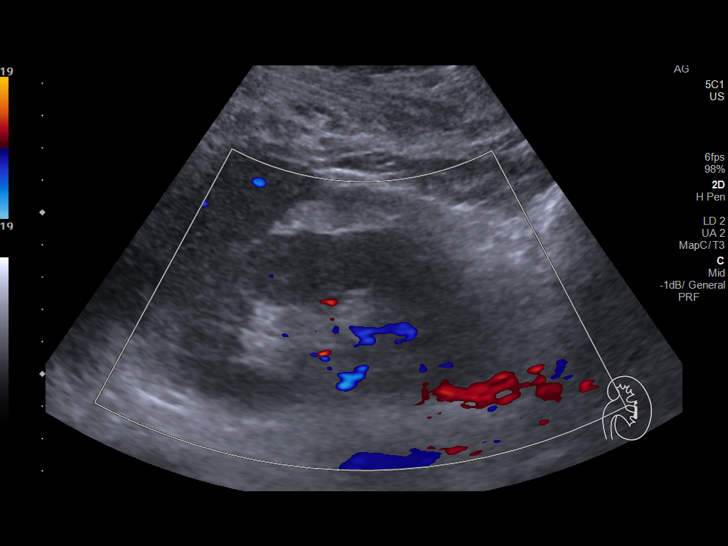
[im 30/45]
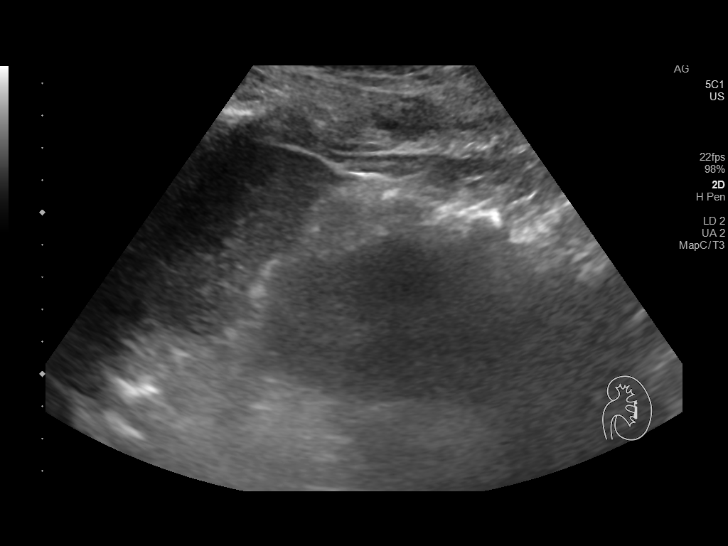
[im 34/45]
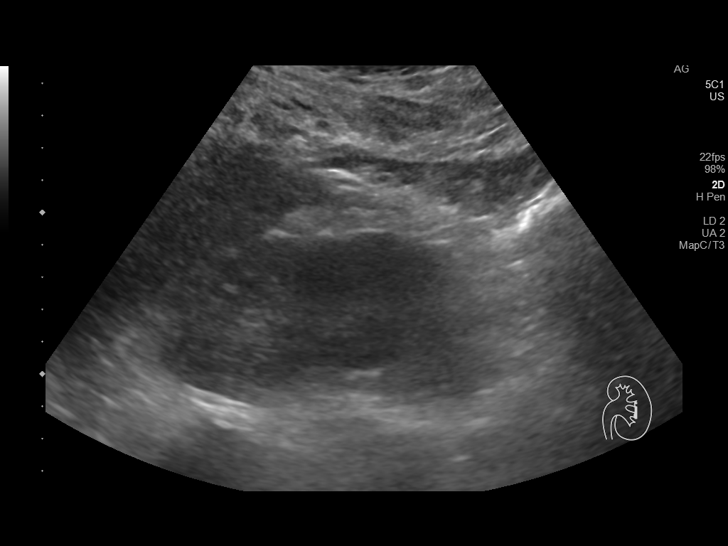
[im 37/45]
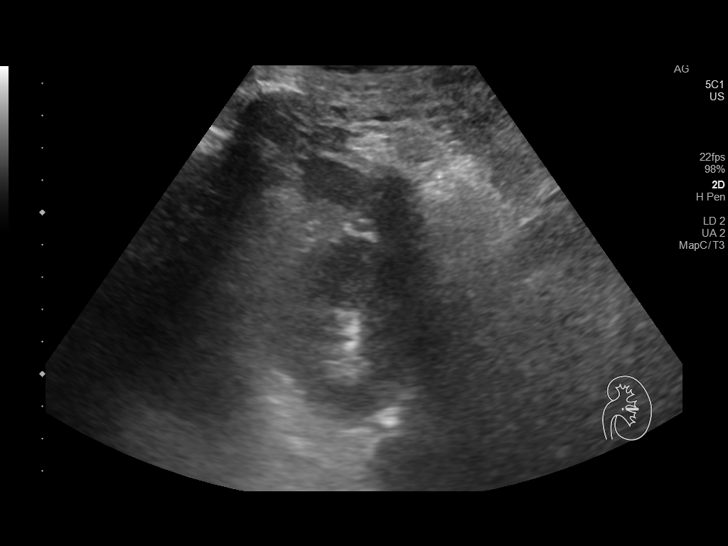
[im 41/45]
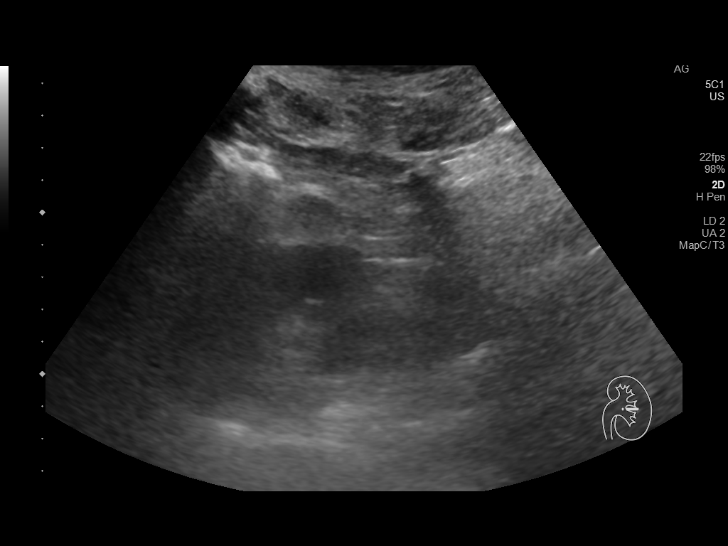
[im 45/45]
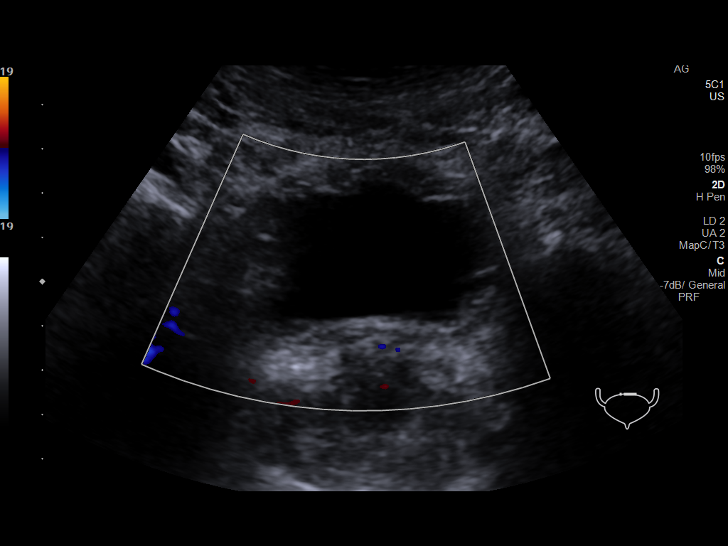

[14 of 25 positions shown; findings below may reference images not displayed]

FINDINGS: Right Kidney:

Length: 10.4 cm. Echogenicity within normal limits. No mass or
hydronephrosis visualized.

Left Kidney:

Length: 10.5 cm. Echogenicity within normal limits. No mass or
hydronephrosis visualized.

Bladder:

Appears normal for degree of bladder distention.

Incidentally noted are small bilateral effusions.
IMPRESSION: No hydronephrosis or acute renal abnormality.

Small bilateral pleural effusions.

## 2020-09-12 ENCOUNTER — Other Ambulatory Visit: Payer: Self-pay

## 2020-09-12 ENCOUNTER — Emergency Department (HOSPITAL_COMMUNITY)
Admission: EM | Admit: 2020-09-12 | Discharge: 2020-09-12 | Disposition: A | Discharge status: Home / Self Care | Payer: Medicare Other | Attending: Student | Admitting: Student

## 2020-09-12 ENCOUNTER — Encounter (HOSPITAL_COMMUNITY): Payer: Self-pay | Admitting: *Deleted

## 2020-09-12 DIAGNOSIS — I120 Hypertensive chronic kidney disease with stage 5 chronic kidney disease or end stage renal disease: Secondary | ICD-10-CM | POA: Diagnosis not present

## 2020-09-12 DIAGNOSIS — Y732 Prosthetic and other implants, materials and accessory gastroenterology and urology devices associated with adverse incidents: Secondary | ICD-10-CM | POA: Diagnosis not present

## 2020-09-12 DIAGNOSIS — Z992 Dependence on renal dialysis: Secondary | ICD-10-CM | POA: Insufficient documentation

## 2020-09-12 DIAGNOSIS — T82590A Other mechanical complication of surgically created arteriovenous fistula, initial encounter: Secondary | ICD-10-CM

## 2020-09-12 DIAGNOSIS — Z7982 Long term (current) use of aspirin: Secondary | ICD-10-CM | POA: Insufficient documentation

## 2020-09-12 DIAGNOSIS — T8243XA Leakage of vascular dialysis catheter, initial encounter: Secondary | ICD-10-CM | POA: Diagnosis present

## 2020-09-12 DIAGNOSIS — Z79899 Other long term (current) drug therapy: Secondary | ICD-10-CM | POA: Insufficient documentation

## 2020-09-12 DIAGNOSIS — Z794 Long term (current) use of insulin: Secondary | ICD-10-CM | POA: Insufficient documentation

## 2020-09-12 DIAGNOSIS — E1122 Type 2 diabetes mellitus with diabetic chronic kidney disease: Secondary | ICD-10-CM | POA: Insufficient documentation

## 2020-09-12 DIAGNOSIS — N186 End stage renal disease: Secondary | ICD-10-CM | POA: Insufficient documentation

## 2020-09-12 LAB — COMPREHENSIVE METABOLIC PANEL
ALT: 15 U/L (ref 0–44)
AST: 13 U/L — ABNORMAL LOW (ref 15–41)
Albumin: 3.8 g/dL (ref 3.5–5.0)
Alkaline Phosphatase: 44 U/L (ref 38–126)
Anion gap: 8 (ref 5–15)
BUN: 25 mg/dL — ABNORMAL HIGH (ref 6–20)
CO2: 32 mmol/L (ref 22–32)
Calcium: 8.3 mg/dL — ABNORMAL LOW (ref 8.9–10.3)
Chloride: 93 mmol/L — ABNORMAL LOW (ref 98–111)
Creatinine, Ser: 5.97 mg/dL — ABNORMAL HIGH (ref 0.61–1.24)
GFR, Estimated: 10 mL/min — ABNORMAL LOW (ref >60)
Glucose, Bld: 150 mg/dL — ABNORMAL HIGH (ref 70–99)
Potassium: 4.4 mmol/L (ref 3.5–5.1)
Sodium: 133 mmol/L — ABNORMAL LOW (ref 135–145)
Total Bilirubin: 0.5 mg/dL (ref 0.3–1.2)
Total Protein: 7.6 g/dL (ref 6.5–8.1)

## 2020-09-12 LAB — CBC WITH DIFFERENTIAL/PLATELET
Abs Immature Granulocytes: 0.01 10*3/uL (ref 0.00–0.07)
Basophils Absolute: 0 10*3/uL (ref 0.0–0.1)
Basophils Relative: 1 %
Eosinophils Absolute: 0.2 10*3/uL (ref 0.0–0.5)
Eosinophils Relative: 2 %
HCT: 35.8 % — ABNORMAL LOW (ref 39.0–52.0)
Hemoglobin: 11.5 g/dL — ABNORMAL LOW (ref 13.0–17.0)
Immature Granulocytes: 0 %
Lymphocytes Relative: 29 %
Lymphs Abs: 1.8 10*3/uL (ref 0.7–4.0)
MCH: 33.2 pg (ref 26.0–34.0)
MCHC: 32.1 g/dL (ref 30.0–36.0)
MCV: 103.5 fL — ABNORMAL HIGH (ref 80.0–100.0)
Monocytes Absolute: 0.8 10*3/uL (ref 0.1–1.0)
Monocytes Relative: 12 %
Neutro Abs: 3.5 10*3/uL (ref 1.7–7.7)
Neutrophils Relative %: 56 %
Platelets: 249 10*3/uL (ref 150–400)
RBC: 3.46 MIL/uL — ABNORMAL LOW (ref 4.22–5.81)
RDW: 13.1 % (ref 11.5–15.5)
WBC: 6.3 10*3/uL (ref 4.0–10.5)
nRBC: 0 % (ref 0.0–0.2)

## 2020-09-12 LAB — PROTIME-INR
INR: 1 (ref 0.8–1.2)
Prothrombin Time: 13.5 seconds (ref 11.4–15.2)

## 2020-09-12 MED ORDER — LIDOCAINE-EPINEPHRINE-TETRACAINE (LET) TOPICAL GEL
3.0000 mL | Freq: Once | TOPICAL | Status: AC
  Administered 2020-09-12: 3 mL via TOPICAL

## 2020-09-12 NOTE — ED Triage Notes (Signed)
Pt brought in from [facility] by [id number] with c/o dialysis catheter bleeding. Clamp in place upon EMS arrival. Pt reports they took him off the dialysis machine around 1000 and they haven't been able to get the site to stop bleeding since then. He reports everytime they attempted to take the clamp off he started bleeding again.

## 2020-09-12 NOTE — Discharge Instructions (Addendum)
If you beging to bleed again, Apply direct, firm pressure to the wound and do not take your fingers off of the wound for at least 20 min. Please wait until tomorrow morning to shower or bathe- do not get your arm wet. If you have BRISK bleeding -follow the directions above and call 911!  Get help right away if: You have pain, numbness, an unusual pale skin color, or blue fingers or sores at the tips of your fingers in the hand on the side of your fistula. You have chills. You have a fever. You have pus or other fluid (drainage) at the vascular access site. You develop skin redness or red streaking on the skin around, above, or below the vascular access. You have bleeding at the vascular access that cannot be easily controlled. Your catheter gets pulled out of place. You feel your heart racing or skipping beats. You have chest pain.

## 2020-09-12 NOTE — ED Provider Notes (Signed)
[facility] EMERGENCY DEPARTMENT Provider Note   CSN: [id number] Arrival date & time: 09/12/20  1323     History Chief Complaint  Patient presents with   Vascular Access Problem    Bleeding    Paul Merritt is a 55 y.o. male who presents emergency department chief complaint of dialysis graft and bleeding.  Patient has a history of end-stage renal disease and had just completed dialysis this morning.  When staff tried to deep access the fistula he had bleeding which would not stop.  Patient arrived via ambulance with a clamp in place over a dressing.  He states the clamp has been in place over the fistula for 1 hour.  He denies a history of easy bleeding.  HPI     Past Medical History:  Diagnosis Date   Anemia    Chronic kidney disease    Diabetes mellitus without complication ([place])    GERD (gastroesophageal reflux disease)    Hypertension    Noncompliance     Patient Active Problem List   Diagnosis Date Noted   ESRD on dialysis ([place]) 02/24/2019   Candida esophagitis ([place]): Probable epr EGD 10/03/2017 10/04/2017   Gastric ulcers: Per EGD 10/03/2017 10/04/2017   Low vitamin B12 level 10/04/2017   Anemia    Chest pain in adult    ARF (acute renal failure) (HCC)    Diabetic hyperosmolar non-ketotic state ([place])    Hypertensive urgency    Melena    GI bleed 10/02/2017   Acute blood loss anemia 10/02/2017   Hypertensive crisis 10/02/2017   Uncontrolled diabetes mellitus ([place]) 10/02/2017   Hyponatremia 10/02/2017   Noncompliance 10/02/2017   AKI (acute kidney injury) ([place]) 10/02/2017    Past Surgical History:  Procedure Laterality Date   AV FISTULA PLACEMENT Left 03/02/2019   Procedure: ARTERIOVENOUS (AV) FISTULA CREATION LEFT ARM;  Surgeon: Marty Heck, MD;  Location: [facility] OR;  Service: Vascular;  Laterality: Left;   BACK SURGERY     [place]   BIOPSY  10/03/2017   Procedure: BIOPSY;  Surgeon: Daneil Dolin, MD;  Location: [facility] ENDO SUITE;  Service:  Endoscopy;;  bx of gastric ulcers   ESOPHAGOGASTRODUODENOSCOPY (EGD) WITH PROPOFOL N/A 10/03/2017   Procedure: ESOPHAGOGASTRODUODENOSCOPY (EGD) WITH PROPOFOL;  Surgeon: Daneil Dolin, MD;  Location: [facility] ENDO SUITE;  Service: Endoscopy;  Laterality: N/A;   EYE SURGERY Right 2020   retinal detatchment   IR FLUORO GUIDE CV LINE RIGHT  02/02/2019   IR REMOVAL TUN CV CATH W/O FL  06/04/2019   IR US GUIDE VASC ACCESS RIGHT  02/02/2019       No family history on file.  Social History   Tobacco Use   Smoking status: Never   Smokeless tobacco: Never  Vaping Use   Vaping Use: Never used  Substance Use Topics   Alcohol use: Not Currently    Comment: Quit drinking alcohol   Drug use: Never    Home Medications Prior to Admission medications   Medication Sig Start Date End Date Taking? Authorizing Provider  aspirin EC 81 MG tablet Take 81 mg by mouth daily.    [provider]  calcium acetate (PHOSLO) 667 MG capsule Take 667 mg by mouth 3 (three) times daily with meals.     [provider]  carvedilol (COREG) 12.5 MG tablet Take 12.5 mg by mouth 2 (two) times daily with a meal.    [provider]  Cholecalciferol (VITAMIN D-3) 125 MCG (5000 UT)  TABS Take 5,000 Units by mouth daily.    [provider]  docusate sodium (COLACE) 100 MG capsule Take 100 mg by mouth daily.    [provider]  furosemide (LASIX) 40 MG tablet Take 40 mg by mouth 2 (two) times daily.    [provider]  hydrALAZINE (APRESOLINE) 50 MG tablet Take 50 mg by mouth 3 (three) times daily.    [provider]  insulin lispro (HUMALOG) 100 UNIT/ML injection Inject 5 Units into the skin 3 (three) times daily before meals.    [provider]  sodium bicarbonate 650 MG tablet Take 650 mg by mouth 3 (three) times daily. 01/07/19   [provider]    Allergies    Patient has no known allergies.  Review of Systems   Review of Systems Ten systems  reviewed and are negative for acute change, except as noted in the HPI.   Physical Exam Updated Vital Signs BP (!) 196/92 (BP Location: Right Arm)   Pulse 74   Temp 98.3 F (36.8 C) (Oral)   Resp 16   Ht '5\' 9"'$  (1.753 m)   Wt 82.1 kg   SpO2 100%   BMI 26.73 kg/m   Physical Exam Physical Exam  Nursing note and vitals reviewed. Constitutional: He appears well-developed and well-nourished. No distress.  HENT:  Head: Normocephalic and atraumatic.  Eyes: Conjunctivae normal are normal. No scleral icterus.  Neck: Normal range of motion. Neck supple.  Cardiovascular: Normal rate, regular rhythm and normal heart sounds.  Dialysis access in the left upper extremity with palpable thrill.  There are 2 small puncture sites which are mildly oozing. Pulmonary/Chest: Effort normal and breath sounds normal. No respiratory distress.  Abdominal: Soft. There is no tenderness.  Musculoskeletal: He exhibits no edema.  Neurological: He is alert.  Skin: Skin is warm and dry. He is not diaphoretic.  Psychiatric: His behavior is normal.   ED Results / Procedures / Treatments   Labs (all labs ordered are listed, but only abnormal results are displayed) Labs Reviewed - No data to display  EKG None  Radiology No results found.  Procedures Procedures   Medications Ordered in ED Medications - No data to display  ED Course  I have reviewed the triage vital signs and the nursing notes.  Pertinent labs & imaging results that were available during my care of the patient were reviewed by me and considered in my medical decision making (see chart for details).  Clinical Course as of 09/12/20 1903  Mon Sep 12, 2020  1419 I have removed the patient's pressure bandage with LET. It appears hemostasis is achieved. Will continue to observe patient.  [AH]    Clinical Course User Index [AH] Margarita Mail, PA-C   MDM Rules/Calculators/A&P                          Patient here with bleeding from  his dialysis access.  There was minimal oozing from dialysis access at puncture sites. I applied a pressure dressing with LET.  Dressing in place for 30 minutes with complete resolution of bleeding.  I ordered and reviewed labs that included patient CMP which shows no abnormalities, CBC with normal platelet levels, normal PT/INR and no evidence of easy bleeding.  The patient will be discharged with safety precautions appears appropriate for discharge at this time Final Clinical Impression(s) / ED Diagnoses Final diagnoses:  None    Rx / DC Orders  ED Discharge Orders     None        Margarita Mail, PA-C 09/12/20 1904    Teressa Lower, MD 09/13/20 [phone]

## 2021-07-04 IMAGING — XA IR FLUORO GUIDE CV LINE*R*
1 series · 1 of 1 positions shown · non-contrast
Comparison: none

INDICATION: End-stage renal disease, access for dialysis

[Series 1: fl (-) angio · 1 of 1 slices shown]
[im 1/1]
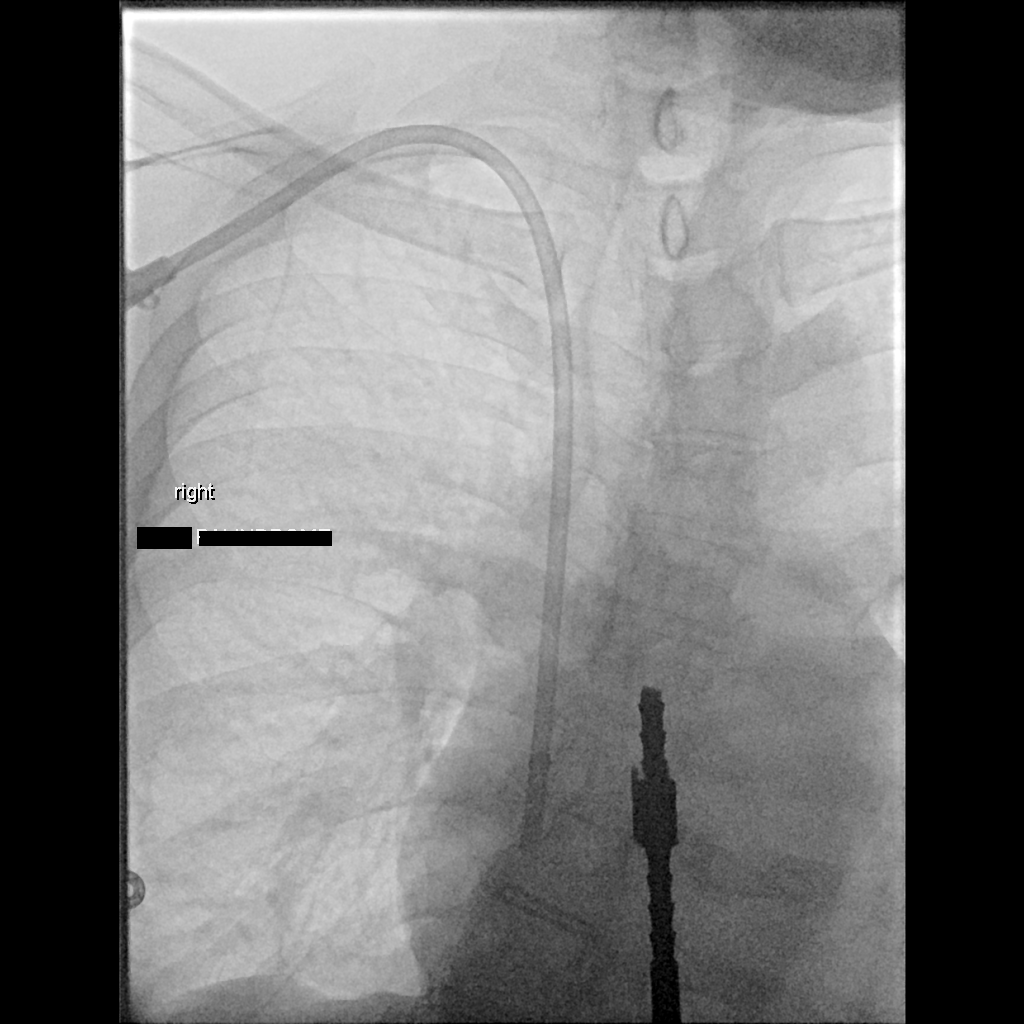

[1 of 1 positions shown; findings below may reference images not displayed]

EXAM:
ULTRASOUND GUIDANCE FOR VASCULAR ACCESS

RIGHT INTERNAL JUGULAR PERMANENT HEMODIALYSIS CATHETER

Date:  02/02/2019 02/02/2019 [DATE]

Radiologist:  Ragan, Latoyia

Guidance:  Ultrasound fluoroscopic

FLUOROSCOPY TIME:  Fluoroscopy Time: 0 minutes 30 seconds (4.0 mGy).

MEDICATIONS:
Ancef 2 g within 1 the procedure

ANESTHESIA/SEDATION:
Versed 1.0 mg IV; Fentanyl 50 mcg IV;

Moderate Sedation Time:  28 minutes

The patient was continuously monitored during the procedure by the
interventional radiology nurse under my direct supervision.

CONTRAST:  None

COMPLICATIONS:
None immediate.

PROCEDURE:
Informed consent was obtained from the patient following explanation
of the procedure, risks, benefits and alternatives. The patient
understands, agrees and consents for the procedure. All questions
were addressed. A time out was performed.

Maximal barrier sterile technique utilized including caps, mask,
sterile gowns, sterile gloves, large sterile drape, hand hygiene,
and 2% chlorhexidine scrub.

Under sterile conditions and local anesthesia, right internal
jugular micropuncture venous access was performed with ultrasound.
Images were obtained for documentation. A guide wire was inserted
followed by a transitional dilator. Next, a 0.035 guidewire was
advanced into the IVC with a 5-French catheter. Measurements were
obtained from the right venotomy site to the proximal right atrium.
In the right infraclavicular chest, a subcutaneous tunnel was
created under sterile conditions and local anesthesia. 1% lidocaine
with epinephrine was utilized for this. The 23 cm tip to cuff
dialysis catheter was tunneled subcutaneously to the venotomy site
and inserted into the SVC/RA junction through a valved peel-away
sheath. Position was confirmed with fluoroscopy. Images were
obtained for documentation. Blood was aspirated from the catheter
followed by saline and heparin flushes. The appropriate volume and
strength of heparin was instilled in each lumen. Caps were applied.
The catheter was secured at the tunnel site with Gelfoam and a
pursestring suture. The venotomy site was closed with subcuticular
Vicryl suture. Dermabond was applied to the small right neck
incision. A dry sterile dressing was applied. The catheter is ready
for use. No immediate complications.
IMPRESSION: Ultrasound and fluoroscopically guided right internal jugular
tunneled hemodialysis catheter (23 cm tip to cuff palindrome
catheter).

## 2021-09-08 ENCOUNTER — Telehealth: Payer: Self-pay | Admitting: *Deleted

## 2021-09-08 NOTE — Chronic Care Management (AMB) (Signed)
  Care Coordination  Outreach Note  09/08/2021 Name: Paul Merritt MRN: [id number] DOB: 1965-12-08   Care Coordination Outreach Attempts  An unsuccessful telephone outreach was attempted today to offer the patient information about available care coordination services as a benefit of their health plan.   Follow Up Plan:  Additional outreach attempts will be made to offer the patient care coordination information and services.   Encounter Outcome:  No Answer  [place]  Direct Dial: [phone]

## 2021-09-15 NOTE — Chronic Care Management (AMB) (Signed)
  Care Coordination  Outreach Note  09/15/2021 Name: Paul Merritt MRN: [id number] DOB: October 18, 1965   Care Coordination Outreach Attempts: A second unsuccessful outreach was attempted today to offer the patient with information about available care coordination services as a benefit of their health plan.     Follow Up Plan:  Additional outreach attempts will be made to offer the patient care coordination information and services.   Encounter Outcome:  No Answer   [place]  Direct Dial: [phone]

## 2021-09-21 NOTE — Chronic Care Management (AMB) (Signed)
  Care Coordination  Outreach Note  09/21/2021 Name: Paul Merritt MRN: [id number] DOB: [birth year]   Care Coordination Outreach Attempts: A third unsuccessful outreach was attempted today to offer the patient with information about available care coordination services as a benefit of their health plan.   Follow Up Plan:  No further outreach attempts will be made at this time. We have been unable to contact the patient to offer or enroll patient in care coordination services  Encounter Outcome:  No Answer  [place]: [phone]

## 2022-02-07 ENCOUNTER — Emergency Department (HOSPITAL_COMMUNITY): Payer: Medicare (Managed Care)

## 2022-02-07 ENCOUNTER — Emergency Department (HOSPITAL_COMMUNITY)
Admission: EM | Admit: 2022-02-07 | Discharge: 2022-02-07 | Disposition: A | Discharge status: Home / Self Care | Payer: Medicare (Managed Care) | Attending: Emergency Medicine | Admitting: Emergency Medicine

## 2022-02-07 ENCOUNTER — Other Ambulatory Visit: Payer: Self-pay

## 2022-02-07 ENCOUNTER — Encounter (HOSPITAL_COMMUNITY): Payer: Self-pay | Admitting: Emergency Medicine

## 2022-02-07 DIAGNOSIS — Z7982 Long term (current) use of aspirin: Secondary | ICD-10-CM | POA: Insufficient documentation

## 2022-02-07 DIAGNOSIS — Z794 Long term (current) use of insulin: Secondary | ICD-10-CM | POA: Diagnosis not present

## 2022-02-07 DIAGNOSIS — R0602 Shortness of breath: Secondary | ICD-10-CM | POA: Diagnosis not present

## 2022-02-07 DIAGNOSIS — Z1152 Encounter for screening for COVID-19: Secondary | ICD-10-CM | POA: Diagnosis not present

## 2022-02-07 DIAGNOSIS — N138 Other obstructive and reflux uropathy: Secondary | ICD-10-CM | POA: Insufficient documentation

## 2022-02-07 DIAGNOSIS — Z9483 Pancreas transplant status: Secondary | ICD-10-CM | POA: Diagnosis not present

## 2022-02-07 DIAGNOSIS — I129 Hypertensive chronic kidney disease with stage 1 through stage 4 chronic kidney disease, or unspecified chronic kidney disease: Secondary | ICD-10-CM | POA: Diagnosis not present

## 2022-02-07 DIAGNOSIS — E860 Dehydration: Secondary | ICD-10-CM | POA: Diagnosis not present

## 2022-02-07 DIAGNOSIS — Z940 Kidney transplant status: Secondary | ICD-10-CM | POA: Diagnosis not present

## 2022-02-07 DIAGNOSIS — R509 Fever, unspecified: Secondary | ICD-10-CM | POA: Insufficient documentation

## 2022-02-07 DIAGNOSIS — N1330 Unspecified hydronephrosis: Secondary | ICD-10-CM | POA: Insufficient documentation

## 2022-02-07 DIAGNOSIS — R112 Nausea with vomiting, unspecified: Secondary | ICD-10-CM | POA: Insufficient documentation

## 2022-02-07 DIAGNOSIS — E1122 Type 2 diabetes mellitus with diabetic chronic kidney disease: Secondary | ICD-10-CM | POA: Diagnosis not present

## 2022-02-07 DIAGNOSIS — E871 Hypo-osmolality and hyponatremia: Secondary | ICD-10-CM | POA: Insufficient documentation

## 2022-02-07 DIAGNOSIS — I2510 Atherosclerotic heart disease of native coronary artery without angina pectoris: Secondary | ICD-10-CM | POA: Diagnosis not present

## 2022-02-07 DIAGNOSIS — N189 Chronic kidney disease, unspecified: Secondary | ICD-10-CM | POA: Insufficient documentation

## 2022-02-07 DIAGNOSIS — Z79899 Other long term (current) drug therapy: Secondary | ICD-10-CM | POA: Diagnosis not present

## 2022-02-07 LAB — CBC WITH DIFFERENTIAL/PLATELET
Abs Immature Granulocytes: 0.07 10*3/uL (ref 0.00–0.07)
Basophils Absolute: 0 10*3/uL (ref 0.0–0.1)
Basophils Relative: 0 %
Eosinophils Absolute: 0 10*3/uL (ref 0.0–0.5)
Eosinophils Relative: 0 %
HCT: 39 % (ref 39.0–52.0)
Hemoglobin: 12.4 g/dL — ABNORMAL LOW (ref 13.0–17.0)
Immature Granulocytes: 1 %
Lymphocytes Relative: 4 %
Lymphs Abs: 0.5 10*3/uL — ABNORMAL LOW (ref 0.7–4.0)
MCH: 30.7 pg (ref 26.0–34.0)
MCHC: 31.8 g/dL (ref 30.0–36.0)
MCV: 96.5 fL (ref 80.0–100.0)
Monocytes Absolute: 2.4 10*3/uL — ABNORMAL HIGH (ref 0.1–1.0)
Monocytes Relative: 21 %
Neutro Abs: 8.3 10*3/uL — ABNORMAL HIGH (ref 1.7–7.7)
Neutrophils Relative %: 74 %
Platelets: 191 10*3/uL (ref 150–400)
RBC: 4.04 MIL/uL — ABNORMAL LOW (ref 4.22–5.81)
RDW: 12.9 % (ref 11.5–15.5)
WBC: 11.3 10*3/uL — ABNORMAL HIGH (ref 4.0–10.5)
nRBC: 0 % (ref 0.0–0.2)

## 2022-02-07 LAB — URINALYSIS, ROUTINE W REFLEX MICROSCOPIC
Bilirubin Urine: NEGATIVE
Glucose, UA: >=500 mg/dL — AB
Ketones, ur: 80 mg/dL — AB
Nitrite: NEGATIVE
Protein, ur: 30 mg/dL — AB
Specific Gravity, Urine: 1.018 (ref 1.005–1.030)
pH: 6 (ref 5.0–8.0)

## 2022-02-07 LAB — COMPREHENSIVE METABOLIC PANEL
ALT: 16 U/L (ref 0–44)
AST: 20 U/L (ref 15–41)
Albumin: 3.3 g/dL — ABNORMAL LOW (ref 3.5–5.0)
Alkaline Phosphatase: 61 U/L (ref 38–126)
Anion gap: 18 — ABNORMAL HIGH (ref 5–15)
BUN: 21 mg/dL — ABNORMAL HIGH (ref 6–20)
CO2: 19 mmol/L — ABNORMAL LOW (ref 22–32)
Calcium: 8.7 mg/dL — ABNORMAL LOW (ref 8.9–10.3)
Chloride: 91 mmol/L — ABNORMAL LOW (ref 98–111)
Creatinine, Ser: 1.17 mg/dL (ref 0.61–1.24)
GFR, Estimated: >60 mL/min (ref >60)
Glucose, Bld: 211 mg/dL — ABNORMAL HIGH (ref 70–99)
Potassium: 4.2 mmol/L (ref 3.5–5.1)
Sodium: 128 mmol/L — ABNORMAL LOW (ref 135–145)
Total Bilirubin: 1.2 mg/dL (ref 0.3–1.2)
Total Protein: 7.4 g/dL (ref 6.5–8.1)

## 2022-02-07 LAB — LACTIC ACID, PLASMA
Lactic Acid, Venous: 1.4 mmol/L (ref 0.5–1.9)
Lactic Acid, Venous: 1.3 mmol/L (ref 0.5–1.9)

## 2022-02-07 LAB — RESP PANEL BY RT-PCR (RSV, FLU A&B, COVID)  RVPGX2
Influenza A by PCR: NEGATIVE
Influenza B by PCR: NEGATIVE
Resp Syncytial Virus by PCR: NEGATIVE
SARS Coronavirus 2 by RT PCR: NEGATIVE

## 2022-02-07 LAB — PROTIME-INR
INR: 1.2 (ref 0.8–1.2)
Prothrombin Time: 15.2 seconds (ref 11.4–15.2)

## 2022-02-07 MED ORDER — ONDANSETRON HCL 4 MG/2ML IJ SOLN
4.0000 mg | Freq: Once | INTRAMUSCULAR | Status: AC
  Administered 2022-02-07: 4 mg via INTRAVENOUS
  Filled 2022-02-07: qty 2

## 2022-02-07 MED ORDER — IOHEXOL 300 MG/ML  SOLN
100.0000 mL | Freq: Once | INTRAMUSCULAR | Status: AC | PRN
  Administered 2022-02-07: 100 mL via INTRAVENOUS

## 2022-02-07 MED ORDER — ONDANSETRON 4 MG PO TBDP: 4.0000 mg | ORAL_TABLET | Freq: Three times a day (TID) | ORAL | 0 refills | Status: AC | PRN

## 2022-02-07 MED ORDER — SODIUM CHLORIDE 0.9 % IV BOLUS
1000.0000 mL | Freq: Once | INTRAVENOUS | Status: AC
  Administered 2022-02-07: 1000 mL via INTRAVENOUS

## 2022-02-07 MED ORDER — ACETAMINOPHEN 500 MG PO TABS
1000.0000 mg | ORAL_TABLET | Freq: Once | ORAL | Status: AC
  Administered 2022-02-07: 1000 mg via ORAL
  Filled 2022-02-07: qty 2

## 2022-02-07 NOTE — ED Notes (Signed)
Both sets of blood cultures drawn before any antibiotic administration  

## 2022-02-07 NOTE — ED Notes (Addendum)
Pt refused 20 panel respiratory swab as pt states "I do not see the point in getting one since I am being discharged, I would want to wait for my results"--MD made aware

## 2022-02-07 NOTE — ED Provider Notes (Signed)
[place] Provider Note   CSN: [id number] Arrival date & time: 02/07/22  1540     History  Chief Complaint  Patient presents with   Fever   Weakness    Paul Merritt is a 57 y.o. male.  Pt is a 57 yo male with pmhx significant for DM, HTN, GERD, CKD s/p renal/pancreas transplant at [facility] in September 2022.  Pt was last seen by the transplant clinic in November and Cr was around 1.  Paul Merritt has been doing well until the 29th.  Pt vomited yesterday once and has not had an appetite.  Paul Merritt has had a fever and said his urine smells bad.  Pt took tylenol last night, but none this am.  Pt denies any pain.  Paul Merritt just feels weak and has no energy.       Home Medications Prior to Admission medications   Medication Sig Start Date End Date Taking? Authorizing Provider  aspirin EC 81 MG tablet Take 81 mg by mouth daily.   Yes [provider]  calcium acetate (PHOSLO) 667 MG capsule Take 667 mg by mouth 3 (three) times daily with meals.    Yes [provider]  carvedilol (COREG) 12.5 MG tablet Take 12.5 mg by mouth 2 (two) times daily with a meal.   Yes [provider]  Cholecalciferol (VITAMIN D-3) 125 MCG (5000 UT) TABS Take 5,000 Units by mouth daily.   Yes [provider]  docusate sodium (COLACE) 100 MG capsule Take 100 mg by mouth daily.   Yes [provider]  FARXIGA 5 MG TABS tablet Take 5 mg by mouth daily.   Yes [provider]  furosemide (LASIX) 40 MG tablet Take 40 mg by mouth 2 (two) times daily.   Yes [provider]  insulin glargine (LANTUS SOLOSTAR) 100 UNIT/ML Solostar Pen Inject 8 Units into the skin at bedtime. 09/30/20  Yes [provider]  insulin lispro (HUMALOG) 100 UNIT/ML injection Inject 5 Units into the skin 3 (three) times daily before meals.   Yes [provider]  mycophenolate (CELLCEPT) 250 MG capsule Take 500 mg by mouth 2 (two) times daily.    Yes [provider]  ondansetron (ZOFRAN-ODT) 4 MG disintegrating tablet Take 1 tablet (4 mg total) by mouth every 8 (eight) hours as needed. 02/07/22  Yes Isla Pence, MD  rosuvastatin (CRESTOR) 5 MG tablet Take 5 mg by mouth daily.   Yes [provider]  sodium bicarbonate 650 MG tablet Take 650 mg by mouth 3 (three) times daily. 01/07/19  Yes [provider]  tacrolimus (PROGRAF) 1 MG capsule Take 1-2 mg by mouth 2 (two) times daily. Take 1 capsule in the morning and 2 capsule at bedtime   Yes [provider]  tamsulosin (FLOMAX) 0.4 MG CAPS capsule Take 0.4 mg by mouth daily. 11/02/20  Yes [provider]      Allergies    Patient has no known allergies.    Review of Systems   Review of Systems  Constitutional:  Positive for fatigue and fever.  Gastrointestinal:  Positive for vomiting.  Neurological:  Positive for weakness.  All other systems reviewed and are negative.   Physical Exam Updated Vital Signs BP 119/64   Pulse 86   Temp 98.7 F (37.1 C) (Oral)   Resp 20   SpO2 98%  Physical Exam Vitals and nursing note reviewed.  Constitutional:      Appearance: Normal  appearance.  HENT:     Head: Normocephalic and atraumatic.     Right Ear: External ear normal.     Left Ear: External ear normal.     Nose: Nose normal.     Mouth/Throat:     Mouth: Mucous membranes are dry.  Eyes:     Extraocular Movements: Extraocular movements intact.     Conjunctiva/sclera: Conjunctivae normal.     Pupils: Pupils are equal, round, and reactive to light.  Cardiovascular:     Rate and Rhythm: Regular rhythm. Tachycardia present.     Pulses: Normal pulses.     Heart sounds: Normal heart sounds.  Pulmonary:     Effort: Pulmonary effort is normal.     Breath sounds: Normal breath sounds.  Abdominal:     General: Abdomen is flat. Bowel sounds are normal.     Palpations: Abdomen is soft.  Musculoskeletal:        General: Normal range of  motion.     Cervical back: Normal range of motion and neck supple.  Skin:    General: Skin is warm.     Capillary Refill: Capillary refill takes less than 2 seconds.  Neurological:     General: No focal deficit present.     Mental Status: Paul Merritt is alert and oriented to person, place, and time.  Psychiatric:        Mood and Affect: Mood normal.        Behavior: Behavior normal.     ED Results / Procedures / Treatments   Labs (all labs ordered are listed, but only abnormal results are displayed) Labs Reviewed  COMPREHENSIVE METABOLIC PANEL - Abnormal; Notable for the following components:      Result Value   Sodium 128 (*)    Chloride 91 (*)    CO2 19 (*)    Glucose, Bld 211 (*)    BUN 21 (*)    Calcium 8.7 (*)    Albumin 3.3 (*)    Anion gap 18 (*)    All other components within normal limits  CBC WITH DIFFERENTIAL/PLATELET - Abnormal; Notable for the following components:   WBC 11.3 (*)    RBC 4.04 (*)    Hemoglobin 12.4 (*)    Neutro Abs 8.3 (*)    Lymphs Abs 0.5 (*)    Monocytes Absolute 2.4 (*)    All other components within normal limits  URINALYSIS, ROUTINE W REFLEX MICROSCOPIC - Abnormal; Notable for the following components:   Glucose, UA >=500 (*)    Hgb urine dipstick MODERATE (*)    Ketones, ur 80 (*)    Protein, ur 30 (*)    Leukocytes,Ua TRACE (*)    Bacteria, UA RARE (*)    All other components within normal limits  RESP PANEL BY RT-PCR (RSV, FLU A&B, COVID)  [id number]  CULTURE, BLOOD (ROUTINE X 2)  CULTURE, BLOOD (ROUTINE X 2)  URINE CULTURE  RESPIRATORY PANEL BY PCR  LACTIC ACID, PLASMA  LACTIC ACID, PLASMA  PROTIME-INR  URINALYSIS, W/ REFLEX TO CULTURE (INFECTION SUSPECTED)    EKG None  Radiology CT CHEST ABDOMEN PELVIS W CONTRAST  Result Date: 02/07/2022 CLINICAL DATA:  Sepsis EXAM: CT CHEST, ABDOMEN, AND PELVIS WITH CONTRAST TECHNIQUE: Multidetector CT imaging of the chest, abdomen and pelvis was performed following the standard protocol  during bolus administration of intravenous contrast. RADIATION DOSE REDUCTION: This exam was performed according to the departmental dose-optimization program which includes automated exposure control, adjustment of the mA and/or  kV according to patient size and/or use of iterative reconstruction technique. CONTRAST:  129mL OMNIPAQUE IOHEXOL 300 MG/ML  SOLN COMPARISON:  CT abdomen pelvis, 01/24/2018 FINDINGS: CT CHEST FINDINGS Cardiovascular: Scattered aortic atherosclerosis. Normal heart size. Left coronary artery calcifications. No pericardial effusion. Mediastinum/Nodes: No enlarged mediastinal, hilar, or axillary lymph nodes. Thyroid gland, trachea, and esophagus demonstrate no significant findings. Lungs/Pleura: Lungs are clear. No pleural effusion or pneumothorax. Musculoskeletal: No chest wall abnormality. No acute osseous findings. CT ABDOMEN PELVIS FINDINGS Hepatobiliary: No solid liver abnormality is seen. No gallstones, gallbladder wall thickening, or biliary dilatation. Pancreas: Unremarkable. No pancreatic ductal dilatation or surrounding inflammatory changes. Spleen: Normal in size without significant abnormality. Adrenals/Urinary Tract: Adrenal glands are unremarkable. Atrophic, non enhancing native kidneys. Right lower quadrant renal transplant allograft, with horseshoe or cross fused ectopic morphology. Hydronephrosis of the right renal allograft moiety, transition point not clearly visualized, although no evident hydronephrosis at the bladder anastomosis (series 2, image 85, 102). Bladder is unremarkable. Stomach/Bowel: Stomach is within normal limits. Appendix appears normal. No evidence of bowel wall thickening, distention, or inflammatory changes. Vascular/Lymphatic: No significant vascular findings are present. No enlarged abdominal or pelvic lymph nodes. Reproductive: No mass or other abnormality. Other: No abdominal wall hernia or abnormality. No ascites. Musculoskeletal: No acute osseous  findings. Dextroscoliosis of the thoracolumbar spine. IMPRESSION: 1. No definite acute CT findings of the chest, abdomen, or pelvis to explain sepsis. 2. Right lower quadrant renal transplant allograft, with horseshoe or cross fused ectopic morphology. Hydronephrosis of the right renal allograft moiety, transition point not clearly visualized due to tortuous ureter in the right lower quadrant, although no evident hydronephrosis at the bladder anastomosis. 3. Atrophic, non enhancing native kidneys. 4. Coronary artery disease. Aortic Atherosclerosis (ICD10-I70.0). Electronically Signed   By: Delanna Ahmadi M.D.   On: 02/07/2022 15:03   DG Chest Port 1 View  Result Date: 02/07/2022 CLINICAL DATA:  141880 SOB (shortness of breath) [id number] EXAM: PORTABLE CHEST 1 VIEW COMPARISON:  01/24/2018 FINDINGS: The heart size and mediastinal contours are within normal limits. Both lungs are clear. The visualized skeletal structures are unremarkable except for spinal fusion hardware, partially imaged and a residual dextroscoliosis of the thoracolumbar spine. Trachea midline. IMPRESSION: No active disease. Electronically Signed   By: Jerilynn Mages.  Shick M.D.   On: 02/07/2022 11:13    Procedures Procedures    Medications Ordered in ED Medications  sodium chloride 0.9 % bolus 1,000 mL (0 mLs Intravenous Stopped 02/07/22 1549)  ondansetron (ZOFRAN) injection 4 mg (4 mg Intravenous Given 02/07/22 1126)  acetaminophen (TYLENOL) tablet 1,000 mg (1,000 mg Oral Given 02/07/22 1111)  iohexol (OMNIPAQUE) 300 MG/ML solution 100 mL (100 mLs Intravenous Contrast Given 02/07/22 1444)    ED Course/ Medical Decision Making/ A&P                             Medical Decision Making Amount and/or Complexity of Data Reviewed Labs: ordered. Radiology: ordered.  Risk OTC drugs. Prescription drug management.   This patient presents to the ED for concern of fever, weakness, this involves an extensive number of treatment options, and is a  complaint that carries with it a high risk of complications and morbidity.  The differential diagnosis includes covid/flu/rsv, electrolyte abn, other infection   Co morbidities that complicate the patient evaluation  DM, HTN, GERD, CKD s/p renal/pancreas transplant   Additional history obtained:  Additional history obtained from epic chart review  Lab Tests:  I Ordered, and personally interpreted labs.  The pertinent results include:  covid/flu neg; cmp with na low at 128, cbc with wbc sl elevated at 11.3; covid/flu/rsv neg; ua +ketones/protein, 21-50 rbcs; lactic nl   Imaging Studies ordered:  I ordered imaging studies including cxr and ct chest/abd/pelvis I independently visualized and interpreted imaging which showed  CXR: No active disease.  CT Chest/abd/pelvis:  No definite acute CT findings of the chest, abdomen, or pelvis to  explain sepsis.  2. Right lower quadrant renal transplant allograft, with horseshoe  or cross fused ectopic morphology. Hydronephrosis of the right renal  allograft moiety, transition point not clearly visualized due to  tortuous ureter in the right lower quadrant, although no evident  hydronephrosis at the bladder anastomosis.  3. Atrophic, non enhancing native kidneys.  4. Coronary artery disease.    Aortic Atherosclerosis (ICD10-I70.0).   I agree with the radiologist interpretation   Cardiac Monitoring:  The patient was maintained on a cardiac monitor.  I personally viewed and interpreted the cardiac monitored which showed an underlying rhythm of: nsr   Medicines ordered and prescription drug management:  I ordered medication including tylenol  for fever; ivfs and zofran for dehydration and nausea  Reevaluation of the patient after these medicines showed that the patient improved I have reviewed the patients home medicines and have made adjustments as needed   Test Considered:    ct   Critical Interventions:  ivfs  Problem  List / ED Course:  Dehydration:  pt is given ivfs and zofran.  Paul Merritt feels much better and is able to tolerate po fluids. Fever:  source unclear.  Pt does have some white cells in his urine, but rare bacteria in his urine.  Paul Merritt denies dysuria.  Paul Merritt said it smells.  This is likely due to concentrated urine.  Urine sent for culture.  If it is positive, Paul Merritt will need to be treated as Paul Merritt's immunocompromised.   Hyponatremia:  likely due to dehydration.  Pt has an appt with his kidney specialist in 2 weeks.  Pt looks great, so I don't think Paul Merritt needs to stay.  Paul Merritt knows to return if worse.    Reevaluation:  After the interventions noted above, I reevaluated the patient and found that they have :improved   Social Determinants of Health:  Lives at home   Dispostion:  After consideration of the diagnostic results and the patients response to treatment, I feel that the patent would benefit from discharge with outpatient f/u.          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

## 2022-02-07 NOTE — ED Triage Notes (Signed)
Pt c/o fever, gen weakness and foul smelling urine x 2 days,  vomited once yesterday and today. Hx of kidney transplant x 1 yr ago. A/o. Nad in triage. Alert/active.

## 2022-02-08 LAB — BLOOD CULTURE ID PANEL (REFLEXED) - BCID2

## 2022-02-08 NOTE — ED Notes (Signed)
Lab called and stated pt had positive blood cultures, Dr. Regenia Skeeter notified and recommends pt come back in to be re-evaluated; called phone number listed and left VM to call back

## 2022-02-08 NOTE — ED Notes (Signed)
Called pt and spoke with him and informed him he needed to come back to ED to be re-evaluated; pt verbalized the understanding of coming back to the ED and stated he would  Called and spoke with pt at 1610 on 02/08/2022

## 2022-02-09 LAB — URINE CULTURE: Culture: <10000 — AB

## 2022-02-10 LAB — CULTURE, BLOOD (ROUTINE X 2): Special Requests: ADEQUATE

## 2022-02-11 ENCOUNTER — Telehealth (HOSPITAL_BASED_OUTPATIENT_CLINIC_OR_DEPARTMENT_OTHER): Payer: Self-pay | Admitting: *Deleted

## 2022-02-11 NOTE — Telephone Encounter (Signed)
Post ED Visit - Positive Culture Follow-up: Successful Patient Follow-Up  Culture assessed and recommendations reviewed by:  []  Elenor Quinones, Pharm.D. []  Heide Guile, Pharm.D., BCPS AQ-ID []  Parks Neptune, Pharm.D., BCPS []  Alycia Rossetti, Pharm.D., BCPS []  Wild Rose, Pharm.D., BCPS, AAHIVP []  Legrand Como, Pharm.D., BCPS, AAHIVP []  Salome Arnt, PharmD, BCPS []  Johnnette Gourd, PharmD, BCPS []  Hughes Better, PharmD, BCPS [x]  Lorelei Pont, PharmD  Positive blood culture   Pt at [facility] RN made aware per Pharmacist. See previous note per pharmacist. No further action needed      Rosie Fate 02/11/2022, 11:38 AM

## 2022-02-11 NOTE — Progress Notes (Signed)
ED Antimicrobial Stewardship Positive Culture Follow Up   Paul Merritt is an 57 y.o. male who presented to [facility] on 02/07/2022 with a chief complaint of  Chief Complaint  Patient presents with   Fever   Weakness    Recent Results (from the past 720 hour(s))  Culture, blood (Routine x 2)     Status: Abnormal   Collection Time: 02/07/22 11:17 AM   Specimen: BLOOD  Result Value Ref Range Status   Specimen Description   Final    BLOOD RIGHT ARM Performed at [facility], [address] [place], [place] [address]    Special Requests   Final    BOTTLES DRAWN AEROBIC AND ANAEROBIC Blood Culture adequate volume Performed at [facility], [address], [place], [place] [address]    Culture  Setup Time   Final    GRAM NEGATIVE RODS AEROBIC BOTTLE Gram Stain Report Called to,Read Back By and Verified With: NICKERSON,[email or web address]  2.1.2024 BY MATTHEWS, B  Performed at [facility], [address], [place], [place] [address]    Culture (A)  Final    PSEUDOMONAS AERUGINOSA SUSCEPTIBILITIES PERFORMED ON PREVIOUS CULTURE WITHIN THE LAST 5 DAYS. Performed at [facility], [place] [address] [place], [place] [address]    Report Status 02/10/2022 FINAL  Final  Culture, blood (Routine x 2)     Status: Abnormal   Collection Time: 02/07/22 11:21 AM   Specimen: BLOOD  Result Value Ref Range Status   Specimen Description   Final    BLOOD RIGHT ARM Performed at [facility], [address], [place], [place] [address]    Special Requests   Final    BOTTLES DRAWN AEROBIC AND ANAEROBIC Blood Culture results may not be optimal due to an inadequate volume of blood received in culture bottles Performed at [place] [place], [place] [address]    Culture  Setup Time   Final    GRAM NEGATIVE RODS ANAEROBIC BOTTLE Gram Stain Report Called to,Read Back By and Verified With: NICKERSON,[email or web address]  BY MATTHEWS, B 2.1.2024 CRITICAL RESULT CALLED TO, READ BACK BY AND VERIFIED WITH: RN TINA  TALBOTT [id number] BY JRS GRAM NEGATIVE RODS AEROBIC BOTTLE ONLY CRITICAL VALUE NOTED.  VALUE IS CONSISTENT WITH PREVIOUSLY REPORTED AND CALLED VALUE. Performed at [facility], [address], [place], [place] [address]    Culture PSEUDOMONAS AERUGINOSA (A)  Final   Report Status 02/10/2022 FINAL  Final   Organism ID, Bacteria PSEUDOMONAS AERUGINOSA  Final      Susceptibility   Pseudomonas aeruginosa - MIC*    CEFTAZIDIME 4 SENSITIVE Sensitive     CIPROFLOXACIN <=0.25 SENSITIVE Sensitive     GENTAMICIN <=1 SENSITIVE Sensitive     IMIPENEM 2 SENSITIVE Sensitive     PIP/TAZO 8 SENSITIVE Sensitive     CEFEPIME 2 SENSITIVE Sensitive     * PSEUDOMONAS AERUGINOSA  Blood Culture ID Panel (Reflexed)     Status: Abnormal   Collection Time: 02/07/22 11:21 AM  Result Value Ref Range Status   Enterococcus faecalis NOT DETECTED NOT DETECTED Final   Enterococcus Faecium NOT DETECTED NOT DETECTED Final   Listeria monocytogenes NOT DETECTED NOT DETECTED Final   Staphylococcus species NOT DETECTED NOT DETECTED Final   Staphylococcus aureus (BCID) NOT DETECTED NOT DETECTED Final   Staphylococcus epidermidis NOT DETECTED NOT DETECTED Final   Staphylococcus lugdunensis NOT DETECTED NOT DETECTED Final   Streptococcus species NOT DETECTED NOT DETECTED Final   Streptococcus agalactiae NOT DETECTED  NOT DETECTED Final   Streptococcus pneumoniae NOT DETECTED NOT DETECTED Final   Streptococcus pyogenes NOT DETECTED NOT DETECTED Final   A.calcoaceticus-baumannii NOT DETECTED NOT DETECTED Final   Bacteroides fragilis NOT DETECTED NOT DETECTED Final   Enterobacterales NOT DETECTED NOT DETECTED Final   Enterobacter cloacae complex NOT DETECTED NOT DETECTED Final   Escherichia coli NOT DETECTED NOT DETECTED Final   Klebsiella aerogenes NOT DETECTED NOT DETECTED Final   Klebsiella oxytoca NOT DETECTED NOT DETECTED Final   Klebsiella pneumoniae NOT DETECTED NOT DETECTED Final   Proteus species NOT DETECTED NOT  DETECTED Final   Salmonella species NOT DETECTED NOT DETECTED Final   Serratia marcescens NOT DETECTED NOT DETECTED Final   Haemophilus influenzae NOT DETECTED NOT DETECTED Final   Neisseria meningitidis NOT DETECTED NOT DETECTED Final   Pseudomonas aeruginosa DETECTED (A) NOT DETECTED Final    Comment: CRITICAL RESULT CALLED TO, READ BACK BY AND VERIFIED WITH: RN TINA TALBOTT [id number] BY JRS    Stenotrophomonas maltophilia NOT DETECTED NOT DETECTED Final   Candida albicans NOT DETECTED NOT DETECTED Final   Candida auris NOT DETECTED NOT DETECTED Final   Candida glabrata NOT DETECTED NOT DETECTED Final   Candida krusei NOT DETECTED NOT DETECTED Final   Candida parapsilosis NOT DETECTED NOT DETECTED Final   Candida tropicalis NOT DETECTED NOT DETECTED Final   Cryptococcus neoformans/gattii NOT DETECTED NOT DETECTED Final   CTX-M ESBL NOT DETECTED NOT DETECTED Final   Carbapenem resistance IMP NOT DETECTED NOT DETECTED Final   Carbapenem resistance KPC NOT DETECTED NOT DETECTED Final   Carbapenem resistance NDM NOT DETECTED NOT DETECTED Final   Carbapenem resistance VIM NOT DETECTED NOT DETECTED Final    Comment: Performed at [facility], [address], [place], [place] [address]  Resp panel by RT-PCR (RSV, Flu A&B, Covid) Anterior Nasal Swab     Status: None   Collection Time: 02/07/22 11:27 AM   Specimen: Anterior Nasal Swab  Result Value Ref Range Status   SARS Coronavirus 2 by RT PCR NEGATIVE NEGATIVE Final    Comment: (NOTE) SARS-CoV-2 target nucleic acids are NOT DETECTED.  The SARS-CoV-2 RNA is generally detectable in upper respiratory specimens during the acute phase of infection. The lowest concentration of SARS-CoV-2 viral copies this assay can detect is 138 copies/mL. A negative result does not preclude SARS-Cov-2 infection and should not be used as the sole basis for treatment or other patient management decisions. A negative result may occur with  improper  specimen collection/handling, submission of specimen other than nasopharyngeal swab, presence of viral mutation(s) within the areas targeted by this assay, and inadequate number of viral copies(<138 copies/mL). A negative result must be combined with clinical observations, patient history, and epidemiological information. The expected result is Negative.  Fact Sheet for Patients:  EntrepreneurPulse.com.au  Fact Sheet for Healthcare Providers:  IncredibleEmployment.be  This test is no t yet approved or cleared by the [place] FDA and  has been authorized for detection and/or diagnosis of SARS-CoV-2 by FDA under an Emergency Use Authorization (EUA). This EUA will remain  in effect (meaning this test can be used) for the duration of the COVID-19 declaration under Section 564(b)(1) of the Act, 21 U.S.C.section 360bbb-3(b)(1), unless the authorization is terminated  or revoked sooner.       Influenza A by PCR NEGATIVE NEGATIVE Final   Influenza B by PCR NEGATIVE NEGATIVE Final    Comment: (NOTE) The Xpert Xpress SARS-CoV-2/FLU/RSV plus assay is intended as  an aid in the diagnosis of influenza from Nasopharyngeal swab specimens and should not be used as a sole basis for treatment. Nasal washings and aspirates are unacceptable for Xpert Xpress SARS-CoV-2/FLU/RSV testing.  Fact Sheet for Patients: EntrepreneurPulse.com.au  Fact Sheet for Healthcare Providers: IncredibleEmployment.be  This test is not yet approved or cleared by the [place] FDA and has been authorized for detection and/or diagnosis of SARS-CoV-2 by FDA under an Emergency Use Authorization (EUA). This EUA will remain in effect (meaning this test can be used) for the duration of the COVID-19 declaration under Section 564(b)(1) of the Act, 21 U.S.C. section 360bbb-3(b)(1), unless the authorization is terminated or revoked.     Resp  Syncytial Virus by PCR NEGATIVE NEGATIVE Final    Comment: (NOTE) Fact Sheet for Patients: EntrepreneurPulse.com.au  Fact Sheet for Healthcare Providers: IncredibleEmployment.be  This test is not yet approved or cleared by the [place] FDA and has been authorized for detection and/or diagnosis of SARS-CoV-2 by FDA under an Emergency Use Authorization (EUA). This EUA will remain in effect (meaning this test can be used) for the duration of the COVID-19 declaration under Section 564(b)(1) of the Act, 21 U.S.C. section 360bbb-3(b)(1), unless the authorization is terminated or revoked.  Performed at [facility], [address], [place], [place] [address]   Urine Culture (for pregnant, neutropenic or urologic patients or patients with an indwelling urinary catheter)     Status: Abnormal   Collection Time: 02/07/22 12:45 PM   Specimen: Urine, Clean Catch  Result Value Ref Range Status   Specimen Description   Final    URINE, CLEAN CATCH Performed at [facility], [address], [place], [place] [address]    Special Requests   Final    NONE Performed at [facility], [address] [place], [place] [address]    Culture (A)  Final    <10,000 COLONIES/mL INSIGNIFICANT GROWTH Performed at [facility] [address], [place], [place] [address]    Report Status 02/09/2022 FINAL  Final    [x]  Patient with blood cultures with pseudomonas aeruginosa (pan-sensitive to anti-pseudomonal agents). Patient previously contacted and informed to return to the ED. Chart review reveals patient is admitted at [facility] for same. I have contacted patient's nurse at [facility] and relayed culture results for her to share with the care team there. I also confirmed they are able to see result in EPIC. No further action is needed.  (Current patient's unit phone [phone])  Lorelei Pont, PharmD, BCPS 02/11/2022 9:53 AM ED Clinical Pharmacist -   [phone]

## 2023-01-29 DIAGNOSIS — Z794 Long term (current) use of insulin: Secondary | ICD-10-CM | POA: Diagnosis not present

## 2023-01-29 DIAGNOSIS — E1165 Type 2 diabetes mellitus with hyperglycemia: Secondary | ICD-10-CM | POA: Diagnosis not present

## 2023-04-24 DIAGNOSIS — Z940 Kidney transplant status: Secondary | ICD-10-CM | POA: Diagnosis not present

## 2023-04-24 DIAGNOSIS — Z4822 Encounter for aftercare following kidney transplant: Secondary | ICD-10-CM | POA: Diagnosis not present

## 2023-04-24 DIAGNOSIS — E1165 Type 2 diabetes mellitus with hyperglycemia: Secondary | ICD-10-CM | POA: Diagnosis not present

## 2023-04-24 DIAGNOSIS — D849 Immunodeficiency, unspecified: Secondary | ICD-10-CM | POA: Diagnosis not present

## 2023-04-24 DIAGNOSIS — T8619 Other complication of kidney transplant: Secondary | ICD-10-CM | POA: Diagnosis not present

## 2023-04-24 DIAGNOSIS — Z794 Long term (current) use of insulin: Secondary | ICD-10-CM | POA: Diagnosis not present

## 2023-04-24 DIAGNOSIS — E782 Mixed hyperlipidemia: Secondary | ICD-10-CM | POA: Diagnosis not present

## 2023-04-24 DIAGNOSIS — I10 Essential (primary) hypertension: Secondary | ICD-10-CM | POA: Diagnosis not present

## 2023-04-24 DIAGNOSIS — E119 Type 2 diabetes mellitus without complications: Secondary | ICD-10-CM | POA: Diagnosis not present

## 2023-04-24 DIAGNOSIS — N135 Crossing vessel and stricture of ureter without hydronephrosis: Secondary | ICD-10-CM | POA: Diagnosis not present

## 2023-04-24 DIAGNOSIS — Z7985 Long-term (current) use of injectable non-insulin antidiabetic drugs: Secondary | ICD-10-CM | POA: Diagnosis not present

## 2023-04-24 DIAGNOSIS — Z79899 Other long term (current) drug therapy: Secondary | ICD-10-CM | POA: Diagnosis not present

## 2023-04-24 DIAGNOSIS — N1330 Unspecified hydronephrosis: Secondary | ICD-10-CM | POA: Diagnosis not present

## 2023-05-16 DIAGNOSIS — D849 Immunodeficiency, unspecified: Secondary | ICD-10-CM | POA: Diagnosis not present

## 2023-05-16 DIAGNOSIS — Z940 Kidney transplant status: Secondary | ICD-10-CM | POA: Diagnosis not present

## 2023-06-25 DIAGNOSIS — E1121 Type 2 diabetes mellitus with diabetic nephropathy: Secondary | ICD-10-CM | POA: Diagnosis not present

## 2023-06-25 DIAGNOSIS — E1122 Type 2 diabetes mellitus with diabetic chronic kidney disease: Secondary | ICD-10-CM | POA: Diagnosis not present

## 2023-06-25 DIAGNOSIS — E7849 Other hyperlipidemia: Secondary | ICD-10-CM | POA: Diagnosis not present

## 2023-06-25 DIAGNOSIS — I10 Essential (primary) hypertension: Secondary | ICD-10-CM | POA: Diagnosis not present

## 2023-06-25 DIAGNOSIS — K5909 Other constipation: Secondary | ICD-10-CM | POA: Diagnosis not present

## 2023-06-25 DIAGNOSIS — Z940 Kidney transplant status: Secondary | ICD-10-CM | POA: Diagnosis not present

## 2023-06-25 DIAGNOSIS — Z0000 Encounter for general adult medical examination without abnormal findings: Secondary | ICD-10-CM | POA: Diagnosis not present

## 2023-06-27 ENCOUNTER — Other Ambulatory Visit (HOSPITAL_COMMUNITY): Payer: Self-pay | Admitting: Internal Medicine

## 2023-06-27 DIAGNOSIS — M810 Age-related osteoporosis without current pathological fracture: Secondary | ICD-10-CM

## 2023-09-25 DIAGNOSIS — K5909 Other constipation: Secondary | ICD-10-CM | POA: Diagnosis not present

## 2023-09-25 DIAGNOSIS — Z0000 Encounter for general adult medical examination without abnormal findings: Secondary | ICD-10-CM | POA: Diagnosis not present

## 2023-09-25 DIAGNOSIS — E1122 Type 2 diabetes mellitus with diabetic chronic kidney disease: Secondary | ICD-10-CM | POA: Diagnosis not present

## 2023-09-25 DIAGNOSIS — N186 End stage renal disease: Secondary | ICD-10-CM | POA: Diagnosis not present

## 2023-09-25 DIAGNOSIS — Z940 Kidney transplant status: Secondary | ICD-10-CM | POA: Diagnosis not present

## 2023-09-25 DIAGNOSIS — L0390 Cellulitis, unspecified: Secondary | ICD-10-CM | POA: Diagnosis not present

## 2023-09-25 DIAGNOSIS — I10 Essential (primary) hypertension: Secondary | ICD-10-CM | POA: Diagnosis not present

## 2023-09-25 DIAGNOSIS — E7849 Other hyperlipidemia: Secondary | ICD-10-CM | POA: Diagnosis not present
